# Patient Record
Sex: Female | Born: 1955 | Race: White | Hispanic: No | Smoking: Current some day smoker
Health system: Southern US, Community
[De-identification: ages and names within clinical notes are randomized; demographics above are authoritative.]

## PROBLEM LIST (undated history)

## (undated) DIAGNOSIS — I10 Essential (primary) hypertension: Secondary | ICD-10-CM

## (undated) DIAGNOSIS — I639 Cerebral infarction, unspecified: Secondary | ICD-10-CM

## (undated) DIAGNOSIS — S5290XA Unspecified fracture of unspecified forearm, initial encounter for closed fracture: Secondary | ICD-10-CM

## (undated) DIAGNOSIS — R51 Headache: Principal | ICD-10-CM

## (undated) DIAGNOSIS — I219 Acute myocardial infarction, unspecified: Secondary | ICD-10-CM

## (undated) DIAGNOSIS — I38 Endocarditis, valve unspecified: Secondary | ICD-10-CM

## (undated) DIAGNOSIS — E785 Hyperlipidemia, unspecified: Secondary | ICD-10-CM

## (undated) HISTORY — DX: Headache: R51

## (undated) HISTORY — PX: TUBAL LIGATION: SHX77

## (undated) HISTORY — PX: CAROTID ENDARTERECTOMY: SUR193

## (undated) HISTORY — DX: Hyperlipidemia, unspecified: E78.5

---

## 2007-02-06 ENCOUNTER — Emergency Department (HOSPITAL_COMMUNITY): Admission: EM | Admit: 2007-02-06 | Discharge: 2007-02-07 | Payer: Self-pay | Admitting: Emergency Medicine

## 2008-03-19 DIAGNOSIS — I219 Acute myocardial infarction, unspecified: Secondary | ICD-10-CM

## 2008-03-19 HISTORY — DX: Acute myocardial infarction, unspecified: I21.9

## 2008-03-19 HISTORY — PX: CORONARY ANGIOPLASTY WITH STENT PLACEMENT: SHX49

## 2009-10-28 ENCOUNTER — Emergency Department (HOSPITAL_COMMUNITY): Admission: EM | Admit: 2009-10-28 | Discharge: 2009-10-28 | Payer: Self-pay | Admitting: Emergency Medicine

## 2011-11-14 ENCOUNTER — Ambulatory Visit: Payer: Medicaid Other | Attending: Neurology | Admitting: Sleep Medicine

## 2011-11-14 DIAGNOSIS — G4700 Insomnia, unspecified: Secondary | ICD-10-CM

## 2011-11-14 DIAGNOSIS — G4730 Sleep apnea, unspecified: Secondary | ICD-10-CM | POA: Insufficient documentation

## 2011-11-14 DIAGNOSIS — G4710 Hypersomnia, unspecified: Secondary | ICD-10-CM | POA: Insufficient documentation

## 2011-11-16 ENCOUNTER — Emergency Department (HOSPITAL_COMMUNITY): Payer: Medicaid Other

## 2011-11-16 ENCOUNTER — Encounter (HOSPITAL_COMMUNITY): Payer: Self-pay | Admitting: *Deleted

## 2011-11-16 ENCOUNTER — Emergency Department (HOSPITAL_COMMUNITY)
Admission: EM | Admit: 2011-11-16 | Discharge: 2011-11-16 | Disposition: A | Discharge status: Home / Self Care | Payer: Medicaid Other | Attending: Emergency Medicine | Admitting: Emergency Medicine

## 2011-11-16 DIAGNOSIS — Z8673 Personal history of transient ischemic attack (TIA), and cerebral infarction without residual deficits: Secondary | ICD-10-CM | POA: Insufficient documentation

## 2011-11-16 DIAGNOSIS — G319 Degenerative disease of nervous system, unspecified: Secondary | ICD-10-CM | POA: Insufficient documentation

## 2011-11-16 DIAGNOSIS — M542 Cervicalgia: Secondary | ICD-10-CM | POA: Insufficient documentation

## 2011-11-16 DIAGNOSIS — M792 Neuralgia and neuritis, unspecified: Secondary | ICD-10-CM

## 2011-11-16 DIAGNOSIS — Z79899 Other long term (current) drug therapy: Secondary | ICD-10-CM | POA: Insufficient documentation

## 2011-11-16 DIAGNOSIS — F17200 Nicotine dependence, unspecified, uncomplicated: Secondary | ICD-10-CM | POA: Insufficient documentation

## 2011-11-16 DIAGNOSIS — IMO0002 Reserved for concepts with insufficient information to code with codable children: Secondary | ICD-10-CM | POA: Insufficient documentation

## 2011-11-16 DIAGNOSIS — I10 Essential (primary) hypertension: Secondary | ICD-10-CM | POA: Insufficient documentation

## 2011-11-16 DIAGNOSIS — I252 Old myocardial infarction: Secondary | ICD-10-CM | POA: Insufficient documentation

## 2011-11-16 DIAGNOSIS — H9209 Otalgia, unspecified ear: Secondary | ICD-10-CM | POA: Insufficient documentation

## 2011-11-16 HISTORY — DX: Cerebral infarction, unspecified: I63.9

## 2011-11-16 HISTORY — DX: Essential (primary) hypertension: I10

## 2011-11-16 HISTORY — DX: Acute myocardial infarction, unspecified: I21.9

## 2011-11-16 LAB — COMPREHENSIVE METABOLIC PANEL
ALT: 14 U/L (ref 0–35)
AST: 14 U/L (ref 0–37)
Albumin: 4 g/dL (ref 3.5–5.2)
Alkaline Phosphatase: 59 U/L (ref 39–117)
BUN: 22 mg/dL (ref 6–23)
CO2: 25 mEq/L (ref 19–32)
Calcium: 10 mg/dL (ref 8.4–10.5)
Chloride: 105 mEq/L (ref 96–112)
Creatinine, Ser: 0.81 mg/dL (ref 0.50–1.10)
GFR calc Af Amer: >90 mL/min (ref >90)
GFR calc non Af Amer: 79 mL/min — ABNORMAL LOW (ref >90)
Glucose, Bld: 99 mg/dL (ref 70–99)
Potassium: 3.7 mEq/L (ref 3.5–5.1)
Sodium: 138 mEq/L (ref 135–145)
Total Bilirubin: 0.2 mg/dL — ABNORMAL LOW (ref 0.3–1.2)
Total Protein: 6.9 g/dL (ref 6.0–8.3)

## 2011-11-16 LAB — CBC WITH DIFFERENTIAL/PLATELET
Basophils Absolute: 0 10*3/uL (ref 0.0–0.1)
Basophils Relative: 1 % (ref 0–1)
Eosinophils Absolute: 0.2 10*3/uL (ref 0.0–0.7)
Eosinophils Relative: 3 % (ref 0–5)
HCT: 38.3 % (ref 36.0–46.0)
Hemoglobin: 13.2 g/dL (ref 12.0–15.0)
Lymphocytes Relative: 40 % (ref 12–46)
Lymphs Abs: 2.4 10*3/uL (ref 0.7–4.0)
MCH: 32.1 pg (ref 26.0–34.0)
MCHC: 34.5 g/dL (ref 30.0–36.0)
MCV: 93.2 fL (ref 78.0–100.0)
Monocytes Absolute: 0.6 10*3/uL (ref 0.1–1.0)
Monocytes Relative: 11 % (ref 3–12)
Neutro Abs: 2.7 10*3/uL (ref 1.7–7.7)
Neutrophils Relative %: 46 % (ref 43–77)
Platelets: 159 10*3/uL (ref 150–400)
RBC: 4.11 MIL/uL (ref 3.87–5.11)
RDW: 13.1 % (ref 11.5–15.5)
WBC: 6 10*3/uL (ref 4.0–10.5)

## 2011-11-16 MED ORDER — OXYCODONE-ACETAMINOPHEN 5-325 MG PO TABS
1.0000 | ORAL_TABLET | Freq: Once | ORAL | Status: AC
  Administered 2011-11-16: 1 via ORAL
  Filled 2011-11-16: qty 1

## 2011-11-16 MED ORDER — CARBAMAZEPINE 200 MG PO TABS
200.0000 mg | ORAL_TABLET | Freq: Once | ORAL | Status: AC
  Administered 2011-11-16: 200 mg via ORAL
  Filled 2011-11-16: qty 1

## 2011-11-16 MED ORDER — CARBAMAZEPINE 200 MG PO TABS: 200.0000 mg | ORAL_TABLET | Freq: Two times a day (BID) | ORAL | Status: DC

## 2011-11-16 NOTE — Procedures (Signed)
NAME:  Emily Patterson, ARMES               ACCOUNT NO.:  [id number]  MEDICAL RECORD NO.:  [id number]          PATIENT TYPE:  OUT  LOCATION:  SLEEP LAB                     FACILITY:  [facility]  PHYSICIAN:  Jlon Betker A. Gerilyn Pilgrim, M.D. DATE OF BIRTH:  Nov 28, 1955  DATE OF STUDY:  11/14/2011                           NOCTURNAL POLYSOMNOGRAM  REFERRING PHYSICIAN:  [name] A. Gerilyn Pilgrim, M.D.  INDICATION:  A 56 year old who presents with hypersomnia, fatigue, and snoring.  The study is being done to evaluate for obstructive sleep apnea syndrome.  MEDICATIONS:  Neurontin, aspirin, Plavix, metoprolol, pravastatin, Triazolam, lisinopril, alprazolam, Percocet.  EPWORTH SLEEPINESS SCALE:  14.  BMI:  26.  ARCHITECTURAL SUMMARY:  The total recording time is 431 minutes.  Sleep efficiency 72%.  Sleep latency 15 minutes.  REM latency 141 minute. Stage N1 12%, N2 71%, N3 10%, and REM sleep 7%.  RESPIRATORY SUMMARY:  Baseline oxygen saturation is 96, lowest saturation 93 during non-REM sleep.  Diagnostic AHI 2.5 and RDI 5.  LIMB MOVEMENT SUMMARY:  PLM index 2.  ELECTROCARDIOGRAM SUMMARY:  Average heart rate is 59 with no significant dysrhythmias observed.  IMPRESSION:  Unremarkable nocturnal polysomnography.    Marios Gaiser A. Gerilyn Pilgrim, M.D.    KAD/MEDQ  D:  11/15/2011 09:34:40  T:  11/16/2011 03:10:31  Job:  [id number]
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
## 2011-11-16 NOTE — ED Notes (Signed)
Rambling speech, says she has trouble walking. Says her ears hurt.
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
## 2011-11-16 NOTE — ED Notes (Signed)
EDP in with pt 

## 2012-05-04 ENCOUNTER — Emergency Department (HOSPITAL_COMMUNITY): Payer: Medicaid Other

## 2012-05-04 ENCOUNTER — Encounter (HOSPITAL_COMMUNITY): Payer: Self-pay

## 2012-05-04 ENCOUNTER — Emergency Department (HOSPITAL_COMMUNITY)
Admission: EM | Admit: 2012-05-04 | Discharge: 2012-05-04 | Disposition: A | Discharge status: Home / Self Care | Payer: Medicaid Other | Attending: Emergency Medicine | Admitting: Emergency Medicine

## 2012-05-04 DIAGNOSIS — E876 Hypokalemia: Secondary | ICD-10-CM

## 2012-05-04 DIAGNOSIS — R197 Diarrhea, unspecified: Secondary | ICD-10-CM

## 2012-05-04 DIAGNOSIS — M545 Low back pain, unspecified: Secondary | ICD-10-CM | POA: Insufficient documentation

## 2012-05-04 DIAGNOSIS — G8929 Other chronic pain: Secondary | ICD-10-CM

## 2012-05-04 DIAGNOSIS — F17200 Nicotine dependence, unspecified, uncomplicated: Secondary | ICD-10-CM | POA: Insufficient documentation

## 2012-05-04 DIAGNOSIS — Z79899 Other long term (current) drug therapy: Secondary | ICD-10-CM | POA: Insufficient documentation

## 2012-05-04 DIAGNOSIS — I10 Essential (primary) hypertension: Secondary | ICD-10-CM | POA: Insufficient documentation

## 2012-05-04 DIAGNOSIS — R05 Cough: Secondary | ICD-10-CM | POA: Insufficient documentation

## 2012-05-04 DIAGNOSIS — H9209 Otalgia, unspecified ear: Secondary | ICD-10-CM | POA: Insufficient documentation

## 2012-05-04 DIAGNOSIS — R52 Pain, unspecified: Secondary | ICD-10-CM

## 2012-05-04 DIAGNOSIS — R059 Cough, unspecified: Secondary | ICD-10-CM | POA: Insufficient documentation

## 2012-05-04 DIAGNOSIS — IMO0002 Reserved for concepts with insufficient information to code with codable children: Secondary | ICD-10-CM | POA: Insufficient documentation

## 2012-05-04 DIAGNOSIS — I252 Old myocardial infarction: Secondary | ICD-10-CM | POA: Insufficient documentation

## 2012-05-04 DIAGNOSIS — Z8673 Personal history of transient ischemic attack (TIA), and cerebral infarction without residual deficits: Secondary | ICD-10-CM | POA: Insufficient documentation

## 2012-05-04 DIAGNOSIS — Z7902 Long term (current) use of antithrombotics/antiplatelets: Secondary | ICD-10-CM | POA: Insufficient documentation

## 2012-05-04 DIAGNOSIS — R1110 Vomiting, unspecified: Secondary | ICD-10-CM

## 2012-05-04 DIAGNOSIS — J189 Pneumonia, unspecified organism: Secondary | ICD-10-CM

## 2012-05-04 DIAGNOSIS — M25519 Pain in unspecified shoulder: Secondary | ICD-10-CM | POA: Insufficient documentation

## 2012-05-04 LAB — URINALYSIS, ROUTINE W REFLEX MICROSCOPIC
Glucose, UA: NEGATIVE mg/dL
Ketones, ur: 15 mg/dL — AB
Leukocytes, UA: NEGATIVE
Nitrite: NEGATIVE
Protein, ur: NEGATIVE mg/dL
Specific Gravity, Urine: 1.02 (ref 1.005–1.030)
Urobilinogen, UA: 0.2 mg/dL (ref 0.0–1.0)
pH: 6 (ref 5.0–8.0)

## 2012-05-04 LAB — CBC WITH DIFFERENTIAL/PLATELET
Basophils Absolute: 0 10*3/uL (ref 0.0–0.1)
Basophils Relative: 0 % (ref 0–1)
Eosinophils Absolute: 0 10*3/uL (ref 0.0–0.7)
Eosinophils Relative: 0 % (ref 0–5)
HCT: 39.7 % (ref 36.0–46.0)
Hemoglobin: 14 g/dL (ref 12.0–15.0)
Lymphocytes Relative: 20 % (ref 12–46)
Lymphs Abs: 0.6 10*3/uL — ABNORMAL LOW (ref 0.7–4.0)
MCH: 32.5 pg (ref 26.0–34.0)
MCHC: 35.3 g/dL (ref 30.0–36.0)
MCV: 92.1 fL (ref 78.0–100.0)
Monocytes Absolute: 0.1 10*3/uL (ref 0.1–1.0)
Monocytes Relative: 4 % (ref 3–12)
Neutro Abs: 2.1 10*3/uL (ref 1.7–7.7)
Neutrophils Relative %: 76 % (ref 43–77)
Platelets: 102 10*3/uL — ABNORMAL LOW (ref 150–400)
RBC: 4.31 MIL/uL (ref 3.87–5.11)
RDW: 12.9 % (ref 11.5–15.5)
WBC: 2.8 10*3/uL — ABNORMAL LOW (ref 4.0–10.5)

## 2012-05-04 LAB — COMPREHENSIVE METABOLIC PANEL
ALT: 28 U/L (ref 0–35)
AST: 34 U/L (ref 0–37)
Albumin: 3.9 g/dL (ref 3.5–5.2)
Alkaline Phosphatase: 124 U/L — ABNORMAL HIGH (ref 39–117)
BUN: 12 mg/dL (ref 6–23)
CO2: 23 mEq/L (ref 19–32)
Calcium: 9.3 mg/dL (ref 8.4–10.5)
Chloride: 95 mEq/L — ABNORMAL LOW (ref 96–112)
Creatinine, Ser: 0.77 mg/dL (ref 0.50–1.10)
GFR calc Af Amer: >90 mL/min (ref >90)
GFR calc non Af Amer: >90 mL/min (ref >90)
Glucose, Bld: 110 mg/dL — ABNORMAL HIGH (ref 70–99)
Potassium: 3.2 mEq/L — ABNORMAL LOW (ref 3.5–5.1)
Sodium: 132 mEq/L — ABNORMAL LOW (ref 135–145)
Total Bilirubin: 0.3 mg/dL (ref 0.3–1.2)
Total Protein: 7.8 g/dL (ref 6.0–8.3)

## 2012-05-04 LAB — URINE MICROSCOPIC-ADD ON

## 2012-05-04 LAB — TROPONIN I: Troponin I: <0.3 ng/mL (ref <0.30)

## 2012-05-04 MED ORDER — MUCINEX DM 30-600 MG PO TB12: ORAL_TABLET | ORAL | Status: DC

## 2012-05-04 MED ORDER — LORAZEPAM 2 MG/ML IJ SOLN
1.0000 mg | Freq: Once | INTRAMUSCULAR | Status: AC
  Administered 2012-05-04: 1 mg via INTRAVENOUS
  Filled 2012-05-04: qty 1

## 2012-05-04 MED ORDER — LEVOFLOXACIN 750 MG PO TABS: 750.0000 mg | ORAL_TABLET | Freq: Every day | ORAL | Status: DC

## 2012-05-04 MED ORDER — CYCLOBENZAPRINE HCL 10 MG PO TABS
10.0000 mg | ORAL_TABLET | Freq: Once | ORAL | Status: AC
  Administered 2012-05-04: 10 mg via ORAL
  Filled 2012-05-04: qty 1

## 2012-05-04 MED ORDER — METOCLOPRAMIDE HCL 5 MG/ML IJ SOLN
10.0000 mg | Freq: Once | INTRAMUSCULAR | Status: AC
  Administered 2012-05-04: 10 mg via INTRAVENOUS
  Filled 2012-05-04: qty 2

## 2012-05-04 MED ORDER — METHOCARBAMOL 500 MG PO TABS: 1000.0000 mg | ORAL_TABLET | Freq: Four times a day (QID) | ORAL | Status: DC

## 2012-05-04 MED ORDER — KETOROLAC TROMETHAMINE 30 MG/ML IJ SOLN
30.0000 mg | Freq: Once | INTRAMUSCULAR | Status: AC
  Administered 2012-05-04: 30 mg via INTRAVENOUS
  Filled 2012-05-04: qty 1

## 2012-05-04 MED ORDER — ONDANSETRON 8 MG PO TBDP: 8.0000 mg | ORAL_TABLET | Freq: Three times a day (TID) | ORAL | Status: DC | PRN

## 2012-05-04 MED ORDER — DIPHENHYDRAMINE HCL 50 MG/ML IJ SOLN
25.0000 mg | Freq: Once | INTRAMUSCULAR | Status: AC
  Administered 2012-05-04: 25 mg via INTRAVENOUS
  Filled 2012-05-04: qty 1

## 2012-05-04 MED ORDER — POTASSIUM CHLORIDE CRYS ER 20 MEQ PO TBCR
40.0000 meq | EXTENDED_RELEASE_TABLET | Freq: Once | ORAL | Status: AC
  Administered 2012-05-04: 40 meq via ORAL
  Filled 2012-05-04: qty 2

## 2012-05-04 MED ORDER — ONDANSETRON HCL 4 MG/2ML IJ SOLN
4.0000 mg | Freq: Once | INTRAMUSCULAR | Status: AC
  Administered 2012-05-04: 4 mg via INTRAVENOUS
  Filled 2012-05-04: qty 2

## 2012-05-04 MED ORDER — LEVOFLOXACIN IN D5W 750 MG/150ML IV SOLN
750.0000 mg | Freq: Once | INTRAVENOUS | Status: AC
  Administered 2012-05-04: 750 mg via INTRAVENOUS
  Filled 2012-05-04: qty 150

## 2012-05-04 MED ORDER — SODIUM CHLORIDE 0.9 % IV SOLN
1000.0000 mL | Freq: Once | INTRAVENOUS | Status: AC
  Administered 2012-05-04: 1000 mL via INTRAVENOUS

## 2012-05-04 MED ORDER — POTASSIUM CHLORIDE CRYS ER 20 MEQ PO TBCR: 20.0000 meq | EXTENDED_RELEASE_TABLET | Freq: Two times a day (BID) | ORAL | Status: DC

## 2012-05-04 MED ORDER — TRAMADOL-ACETAMINOPHEN 37.5-325 MG PO TABS: ORAL_TABLET | ORAL | Status: DC

## 2012-05-04 MED ORDER — FENTANYL CITRATE 0.05 MG/ML IJ SOLN
50.0000 ug | INTRAMUSCULAR | Status: DC | PRN
  Administered 2012-05-04: 50 ug via INTRAVENOUS
  Filled 2012-05-04: qty 2

## 2012-05-04 MED ORDER — SODIUM CHLORIDE 0.9 % IV SOLN
1000.0000 mL | INTRAVENOUS | Status: DC
  Administered 2012-05-04: 1000 mL via INTRAVENOUS

## 2012-05-04 NOTE — ED Provider Notes (Signed)
History    This chart was scribed for Ward Givens, MD by Melba Coon, ED Scribe. The patient was seen in room APA10/APA10 and the patient's care was started at 11:16AM.    CSN: [id number]  Arrival date & time 05/04/12  1014   First MD Initiated Contact with Patient 05/04/12 1030      Chief Complaint  Patient presents with  . Emesis    (Consider location/radiation/quality/duration/timing/severity/associated sxs/prior treatment) The history is provided by the patient. No language interpreter was used.   RUDEAN ICENHOUR is a 57 y.o. female who presents to the Emergency Department complaining of constant, sharp, moderate to severe lower back pain that radiates down to her upper legs with an onset 4 days ago. She reports that her back pain along with a persistent cough and bilateral shoulder pain started in he morning 4 days ago. She reports she saw her cardiologist who is also her PCP the same day and he did not think it was heart-related. She was Rx flexeril which did not alleviate the pain. Later that day of onset, she began to have emesis about 4-5x a day along with some chills. She reports decreased sleep compared to baseline due to the severity of her back pain. She also reports diarrhea x3 that started today. Denies HA, fever, neck pain, sore throat, rash, CP, SOB, abdominal pain, dysuria, or extremity edema, weakness, numbness, or tingling.   She reports a chronic history of right otalgia for the past 28 years; she reports Dr. Gerilyn Pilgrim injected her in the scalp in his office office alleviated her pain 3 months ago and she was pain-free however the pain is starting to come back. She's encouraged to followup with Dr. Gerilyn Pilgrim to get the injection again.. No other pertinent medical symptoms. She reports she is an everyday smoker but has not smoked in the last 3 days.  PCP/Cardiologist: Dr Earna Coder in [facility]  Past Medical History  Diagnosis Date  . Hypertension   . Myocardial  infarction   . Stroke     Past Surgical History  Procedure Laterality Date  . Coronary angioplasty with stent placement    . Tubal ligation      No family history on file.  History  Substance Use Topics  . Smoking status: Current Every Day Smoker  . Smokeless tobacco: Not on file  . Alcohol Use: No   patient states she's on disability for her heart Lives at home Lives with spouse  OB History   Grav Para Term Preterm Abortions TAB SAB Ect Mult Living                  Review of Systems  Gastrointestinal: Positive for vomiting.   10 Systems reviewed and all are negative for acute change except as noted in the HPI.   Allergies  Other and Latex  Home Medications   Current Outpatient Rx  Name  Route  Sig  Dispense  Refill  . albuterol (PROAIR HFA) 108 (90 BASE) MCG/ACT inhaler   Inhalation   Inhale 2 puffs into the lungs every 6 (six) hours as needed for wheezing or shortness of breath.          . ALPRAZolam (XANAX) 1 MG tablet   Oral   Take 1 mg by mouth 3 (three) times daily. For anxiety         . aspirin EC 81 MG tablet   Oral   Take 81 mg by mouth daily.         Marland Kitchen  clopidogrel (PLAVIX) 75 MG tablet   Oral   Take 75 mg by mouth daily.         . metoprolol tartrate (LOPRESSOR) 25 MG tablet   Oral   Take 25 mg by mouth 2 (two) times daily.         . mometasone (NASONEX) 50 MCG/ACT nasal spray   Nasal   Place 2 sprays into the nose daily.         . pravastatin (PRAVACHOL) 80 MG tablet   Oral   Take 80 mg by mouth every evening.           BP 137/85  Pulse 95  Temp(Src) 99.2 F (37.3 C) (Oral)  Resp 20  Ht 5\' 2"  (1.575 m)  Wt 144 lb (65.318 kg)  BMI 26.33 kg/m2  SpO2 93%  Vital signs normal    Physical Exam  Nursing note and vitals reviewed. Constitutional: She is oriented to person, place, and time. She appears well-developed and well-nourished.  Non-toxic appearance. She does not appear ill. No distress.  HENT:  Head:  Normocephalic and atraumatic.  Right Ear: External ear normal.  Left Ear: External ear normal.  Nose: Nose normal. No mucosal edema or rhinorrhea.  Mouth/Throat: Oropharynx is clear and moist and mucous membranes are normal. No dental abscesses or edematous.  Eyes: Conjunctivae and EOM are normal. Pupils are equal, round, and reactive to light.  Neck: Normal range of motion and full passive range of motion without pain. Neck supple.  Cardiovascular: Normal rate, regular rhythm and normal heart sounds.  Exam reveals no gallop and no friction rub.   No murmur heard. Pulmonary/Chest: Effort normal and breath sounds normal. No respiratory distress. She has no wheezes. She has no rhonchi. She has no rales. She exhibits no tenderness and no crepitus.  Abdominal: Soft. Normal appearance and bowel sounds are normal. She exhibits no distension. There is tenderness. There is no rebound and no guarding.  Mild lower abdominal tenderness.  Musculoskeletal: Normal range of motion. She exhibits tenderness. She exhibits no edema.       Lumbar back: She exhibits tenderness, bony tenderness, pain and spasm. She exhibits no swelling, no edema and no deformity.  Back is non-tender in the thoracic and lumbar areas but lower back is tender in the sacral area. Moves all extremities well.   Neurological: She is alert and oriented to person, place, and time. She has normal strength. No cranial nerve deficit.  Skin: Skin is warm, dry and intact. No rash noted. She is not diaphoretic. No erythema. No pallor.  Psychiatric: She has a normal mood and affect. Her speech is normal and behavior is normal. Her mood appears not anxious.    ED Course  Procedures (including critical care time)  Medications  0.9 %  sodium chloride infusion (0 mLs Intravenous Stopped 05/04/12 1230)    Followed by  0.9 %  sodium chloride infusion (1,000 mLs Intravenous New Bag/Given 05/04/12 1502)  ondansetron (ZOFRAN) injection 4 mg (4 mg  Intravenous Given 05/04/12 1145)  LORazepam (ATIVAN) injection 1 mg (1 mg Intravenous Given 05/04/12 1148)  ketorolac (TORADOL) 30 MG/ML injection 30 mg (30 mg Intravenous Given 05/04/12 1143)  levofloxacin (LEVAQUIN) IVPB 750 mg (0 mg Intravenous Stopped 05/04/12 1350)  potassium chloride SA (K-DUR,KLOR-CON) CR tablet 40 mEq (40 mEq Oral Given 05/04/12 1253)  cyclobenzaprine (FLEXERIL) tablet 10 mg (10 mg Oral Given 05/04/12 1359)  metoCLOPramide (REGLAN) injection 10 mg (10 mg Intravenous Given 05/04/12 1459)  diphenhydrAMINE (  BENADRYL) injection 25 mg (25 mg Intravenous Given 05/04/12 1502)     DIAGNOSTIC STUDIES: Oxygen Saturation is 93% on room air, low by my interpretation.    COORDINATION OF CARE:  Although patient states the Flexeril did not help her back pain she currently now does not have pain in her upper back in the thoracic or lumbar area which is where she stated her pain had been when it started 4 days ago. She now just has pain in her lower sacral region.  11:21AM - Toradol, ativan, Zofran, and IV fluids will be ordered for Emiya D Christopoulos. She is advised to follow up with her PCP for her chronic otalgia.  12:35PM- lab results reviewed  12:38PM - recheck; imaging reviewed and has a right-sided pneumonia. KCl tablet and Levaquin will be ordered for Mrs Kohen.  15:45 Patient ambulated with no assistance. O2 sat 97-100% while ambulating.   Results for orders placed during the hospital encounter of 05/04/12  URINALYSIS, ROUTINE W REFLEX MICROSCOPIC      Result Value Range   Color, Urine YELLOW  YELLOW   APPearance CLEAR  CLEAR   Specific Gravity, Urine 1.020  1.005 - 1.030   pH 6.0  5.0 - 8.0   Glucose, UA NEGATIVE  NEGATIVE mg/dL   Hgb urine dipstick SMALL (*) NEGATIVE   Bilirubin Urine SMALL (*) NEGATIVE   Ketones, ur 15 (*) NEGATIVE mg/dL   Protein, ur NEGATIVE  NEGATIVE mg/dL   Urobilinogen, UA 0.2  0.0 - 1.0 mg/dL   Nitrite NEGATIVE  NEGATIVE   Leukocytes, UA  NEGATIVE  NEGATIVE  CBC WITH DIFFERENTIAL      Result Value Range   WBC 2.8 (*) 4.0 - 10.5 K/uL   RBC 4.31  3.87 - 5.11 MIL/uL   Hemoglobin 14.0  12.0 - 15.0 g/dL   HCT 95.6  21.3 - 08.6 %   MCV 92.1  78.0 - 100.0 fL   MCH 32.5  26.0 - 34.0 pg   MCHC 35.3  30.0 - 36.0 g/dL   RDW 57.8  46.9 - 62.9 %   Platelets 102 (*) 150 - 400 K/uL   Neutrophils Relative 76  43 - 77 %   Neutro Abs 2.1  1.7 - 7.7 K/uL   Lymphocytes Relative 20  12 - 46 %   Lymphs Abs 0.6 (*) 0.7 - 4.0 K/uL   Monocytes Relative 4  3 - 12 %   Monocytes Absolute 0.1  0.1 - 1.0 K/uL   Eosinophils Relative 0  0 - 5 %   Eosinophils Absolute 0.0  0.0 - 0.7 K/uL   Basophils Relative 0  0 - 1 %   Basophils Absolute 0.0  0.0 - 0.1 K/uL  COMPREHENSIVE METABOLIC PANEL      Result Value Range   Sodium 132 (*) 135 - 145 mEq/L   Potassium 3.2 (*) 3.5 - 5.1 mEq/L   Chloride 95 (*) 96 - 112 mEq/L   CO2 23  19 - 32 mEq/L   Glucose, Bld 110 (*) 70 - 99 mg/dL   BUN 12  6 - 23 mg/dL   Creatinine, Ser 5.28  0.50 - 1.10 mg/dL   Calcium 9.3  8.4 - 41.3 mg/dL   Total Protein 7.8  6.0 - 8.3 g/dL   Albumin 3.9  3.5 - 5.2 g/dL   AST 34  0 - 37 U/L   ALT 28  0 - 35 U/L   Alkaline Phosphatase 124 (*) 39 - 117 U/L  Total Bilirubin 0.3  0.3 - 1.2 mg/dL   GFR calc non Af Amer >90  >90 mL/min   GFR calc Af Amer >90  >90 mL/min  TROPONIN I      Result Value Range   Troponin I <0.30  <0.30 ng/mL  URINE MICROSCOPIC-ADD ON      Result Value Range   Squamous Epithelial / LPF FEW (*) RARE   WBC, UA 3-6  <3 WBC/hpf   RBC / HPF 3-6  <3 RBC/hpf    Laboratory interpretation all normal except hypokalemia, mild hyponatremia, low total white cell count without neutropenia consistent with viral illness   Dg Chest 2 View  05/04/2012  *RADIOLOGY REPORT*  Clinical Data: Cough, chills.  CHEST - 2 VIEW  Comparison: None  Findings: Area consolidation in the right lung base laterally compatible with pneumonia.  This appears to be located anteriorly  on the lateral view, likely within the lateral segment of the right middle lobe.  Left lung is clear.  Heart is normal size.  No effusions or acute bony abnormality.  IMPRESSION: Right basilar pneumonia, likely right middle lobe.   Original Report Authenticated By: Charlett Nose, M.D.     Date: 05/04/2012  Rate: 90  Rhythm: normal sinus rhythm  QRS Axis: normal  Intervals: normal  ST/T Wave abnormalities: nonspecific ST/T changes  Conduction Disutrbances:none  Narrative Interpretation:   Old EKG Reviewed: none available     1. Community acquired pneumonia   2. Vomiting and diarrhea   3. Hypokalemia   4. Body aches   5. Chronic ear pain, right     New Prescriptions   DEXTROMETHORPHAN-GUAIFENESIN (MUCINEX DM) 30-600 MG TB12    Take 1 po BID prn cough   LEVOFLOXACIN (LEVAQUIN) 750 MG TABLET    Take 1 tablet (750 mg total) by mouth daily.   METHOCARBAMOL (ROBAXIN) 500 MG TABLET    Take 2 tablets (1,000 mg total) by mouth 4 (four) times daily.   ONDANSETRON (ZOFRAN ODT) 8 MG DISINTEGRATING TABLET    Take 1 tablet (8 mg total) by mouth every 8 (eight) hours as needed for nausea.   POTASSIUM CHLORIDE SA (K-DUR,KLOR-CON) 20 MEQ TABLET    Take 1 tablet (20 mEq total) by mouth 2 (two) times daily.   TRAMADOL-ACETAMINOPHEN (ULTRACET) 37.5-325 MG PER TABLET    2 tabs po QID prn pain    Plan discharge  Devoria Albe, MD, FACEP   MDM   I personally performed the services described in this documentation, which was scribed in my presence. The recorded information has been reviewed and considered.  Devoria Albe, MD, Armando Gang        Ward Givens, MD 05/04/12 [phone]

## 2012-05-04 NOTE — ED Notes (Signed)
Patient ambulated with no assistance.  o2 sat 97-100% while ambulating.

## 2012-05-04 NOTE — ED Notes (Signed)
Complain of back pain, ear pain, headache and vomiting

## 2012-07-27 ENCOUNTER — Emergency Department (HOSPITAL_COMMUNITY)
Admission: EM | Admit: 2012-07-27 | Discharge: 2012-07-28 | Discharge status: Against Medical Advice | Payer: Medicaid Other | Attending: Emergency Medicine | Admitting: Emergency Medicine

## 2012-07-27 ENCOUNTER — Encounter (HOSPITAL_COMMUNITY): Payer: Self-pay | Admitting: *Deleted

## 2012-07-27 DIAGNOSIS — X58XXXA Exposure to other specified factors, initial encounter: Secondary | ICD-10-CM | POA: Insufficient documentation

## 2012-07-27 DIAGNOSIS — Y929 Unspecified place or not applicable: Secondary | ICD-10-CM | POA: Insufficient documentation

## 2012-07-27 DIAGNOSIS — Y939 Activity, unspecified: Secondary | ICD-10-CM | POA: Insufficient documentation

## 2012-07-27 DIAGNOSIS — I10 Essential (primary) hypertension: Secondary | ICD-10-CM | POA: Insufficient documentation

## 2012-07-27 DIAGNOSIS — F17200 Nicotine dependence, unspecified, uncomplicated: Secondary | ICD-10-CM | POA: Insufficient documentation

## 2012-07-27 DIAGNOSIS — S4980XA Other specified injuries of shoulder and upper arm, unspecified arm, initial encounter: Secondary | ICD-10-CM | POA: Insufficient documentation

## 2012-07-27 DIAGNOSIS — S46909A Unspecified injury of unspecified muscle, fascia and tendon at shoulder and upper arm level, unspecified arm, initial encounter: Secondary | ICD-10-CM | POA: Insufficient documentation

## 2012-07-27 NOTE — ED Notes (Signed)
Pt continues to demand pain medications.  Reinforced with pt that until the she was assessed by physician we were unable to administer medications.  Pt signed forms for release of records from [place] and [place].

## 2012-07-27 NOTE — ED Notes (Signed)
Pt observed resting with eyes closed.  Respirations regular, even and labored.  Pt responds to verbal stimuli, but speech remains slurred and pt needing to have questions repeated to obtain appropriate answers.

## 2012-07-27 NOTE — ED Notes (Signed)
Spoke with house supervisor at [facility] to clarify if pt had or hadn't been seen.  Per supervisor, they have no record of pt being at facility in previous 24 hours.

## 2012-07-27 NOTE — ED Notes (Addendum)
Pt reports both arms are broken.  States that she was at [facility] about 3 hours ago and was not given any mediation for pain.  Pt also states that she went to [facility].  Reoriented pt and informed her that she was presently at [facility].  Pt reports that she was also seen at [facility], but then denies being seen there. Pt tearful and demanding pain medication.  Reinforced with pt that we are unable to give medication till ordered by physician.  Pt speech is somewhat slurred and gait slightly unsteady.

## 2012-07-27 NOTE — ED Notes (Signed)
Pt yelling and very angry that she hasn't been seen yet to have pain medications ordered.  Explained to pt that she will be seen as soon as possible, but that I would be unable to provide exact time.  Pt yelling and cursing. States "I'll just go to [employer] with pt that she will be seen, and encouraged patience.

## 2012-07-28 NOTE — ED Notes (Signed)
Pt visualized by security leaving property as the passenger in a private vehicle.

## 2012-07-28 NOTE — ED Notes (Addendum)
Pt demanding to leave stating that she was in pain and wasn't waiting any longer.  Reinforced with pt that provider is aware of her needs, and will be seeing her as soon as able.  Also informed pt that records from treatment received from [facility] have been requested so that provider has a the most accurate picture of condition and best determination of treatment.  Pt demanding to leave stating "I'll just go to damn [place]!"  Requested pt sign AMA form prior to discharge.  Pt stated "No, I"m not signing shit!"  Pt left department.  Gait slightly unsteady and pt unable to walk straight line down hall.

## 2012-08-13 ENCOUNTER — Other Ambulatory Visit (HOSPITAL_COMMUNITY): Payer: Self-pay | Admitting: Orthopaedic Surgery

## 2012-08-14 ENCOUNTER — Other Ambulatory Visit (HOSPITAL_COMMUNITY): Payer: Self-pay | Admitting: *Deleted

## 2012-08-14 ENCOUNTER — Encounter (HOSPITAL_COMMUNITY): Payer: Self-pay | Admitting: Pharmacy Technician

## 2012-08-14 ENCOUNTER — Encounter (HOSPITAL_COMMUNITY): Payer: Self-pay | Admitting: *Deleted

## 2012-08-15 ENCOUNTER — Ambulatory Visit (HOSPITAL_COMMUNITY): Payer: Medicaid Other

## 2012-08-15 ENCOUNTER — Ambulatory Visit (HOSPITAL_COMMUNITY): Payer: Medicaid Other | Admitting: Anesthesiology

## 2012-08-15 ENCOUNTER — Ambulatory Visit (HOSPITAL_COMMUNITY)
Admission: RE | Admit: 2012-08-15 | Discharge: 2012-08-17 | Disposition: A | Discharge status: Home / Self Care | Payer: Medicaid Other | Source: Ambulatory Visit | Attending: Orthopaedic Surgery | Admitting: Orthopaedic Surgery

## 2012-08-15 ENCOUNTER — Encounter (HOSPITAL_COMMUNITY): Payer: Self-pay | Admitting: *Deleted

## 2012-08-15 ENCOUNTER — Encounter (HOSPITAL_COMMUNITY): Payer: Self-pay | Admitting: Anesthesiology

## 2012-08-15 ENCOUNTER — Encounter (HOSPITAL_COMMUNITY)
Admission: RE | Disposition: A | Discharge status: Home / Self Care | Payer: Self-pay | Source: Ambulatory Visit | Attending: Orthopaedic Surgery

## 2012-08-15 DIAGNOSIS — I252 Old myocardial infarction: Secondary | ICD-10-CM | POA: Insufficient documentation

## 2012-08-15 DIAGNOSIS — S52501A Unspecified fracture of the lower end of right radius, initial encounter for closed fracture: Secondary | ICD-10-CM

## 2012-08-15 DIAGNOSIS — R209 Unspecified disturbances of skin sensation: Secondary | ICD-10-CM | POA: Insufficient documentation

## 2012-08-15 DIAGNOSIS — F17200 Nicotine dependence, unspecified, uncomplicated: Secondary | ICD-10-CM | POA: Insufficient documentation

## 2012-08-15 DIAGNOSIS — W108XXA Fall (on) (from) other stairs and steps, initial encounter: Secondary | ICD-10-CM | POA: Insufficient documentation

## 2012-08-15 DIAGNOSIS — Y92009 Unspecified place in unspecified non-institutional (private) residence as the place of occurrence of the external cause: Secondary | ICD-10-CM | POA: Insufficient documentation

## 2012-08-15 DIAGNOSIS — S52599A Other fractures of lower end of unspecified radius, initial encounter for closed fracture: Secondary | ICD-10-CM | POA: Insufficient documentation

## 2012-08-15 DIAGNOSIS — I10 Essential (primary) hypertension: Secondary | ICD-10-CM | POA: Insufficient documentation

## 2012-08-15 DIAGNOSIS — Z79899 Other long term (current) drug therapy: Secondary | ICD-10-CM | POA: Insufficient documentation

## 2012-08-15 HISTORY — PX: OPEN REDUCTION INTERNAL FIXATION (ORIF) DISTAL RADIAL FRACTURE: SHX5989

## 2012-08-15 HISTORY — DX: Endocarditis, valve unspecified: I38

## 2012-08-15 HISTORY — DX: Unspecified fracture of unspecified forearm, initial encounter for closed fracture: S52.90XA

## 2012-08-15 LAB — CBC
HCT: 31.7 % — ABNORMAL LOW (ref 36.0–46.0)
Hemoglobin: 10.7 g/dL — ABNORMAL LOW (ref 12.0–15.0)
MCH: 31.6 pg (ref 26.0–34.0)
MCHC: 33.8 g/dL (ref 30.0–36.0)
MCV: 93.5 fL (ref 78.0–100.0)
Platelets: 169 10*3/uL (ref 150–400)
RBC: 3.39 MIL/uL — ABNORMAL LOW (ref 3.87–5.11)
RDW: 13.9 % (ref 11.5–15.5)
WBC: 4.7 10*3/uL (ref 4.0–10.5)

## 2012-08-15 LAB — SURGICAL PCR SCREEN
MRSA, PCR: NEGATIVE
Staphylococcus aureus: NEGATIVE

## 2012-08-15 LAB — BASIC METABOLIC PANEL
BUN: 10 mg/dL (ref 6–23)
CO2: 30 mEq/L (ref 19–32)
Calcium: 9.5 mg/dL (ref 8.4–10.5)
Chloride: 103 mEq/L (ref 96–112)
Creatinine, Ser: 0.84 mg/dL (ref 0.50–1.10)
GFR calc Af Amer: 87 mL/min — ABNORMAL LOW (ref >90)
GFR calc non Af Amer: 75 mL/min — ABNORMAL LOW (ref >90)
Glucose, Bld: 92 mg/dL (ref 70–99)
Potassium: 4.1 mEq/L (ref 3.5–5.1)
Sodium: 139 mEq/L (ref 135–145)

## 2012-08-15 SURGERY — OPEN REDUCTION INTERNAL FIXATION (ORIF) DISTAL RADIUS FRACTURE
Anesthesia: General | Site: Wrist | Laterality: Right | Wound class: Clean

## 2012-08-15 MED ORDER — CEFAZOLIN SODIUM-DEXTROSE 2-3 GM-% IV SOLR
INTRAVENOUS | Status: AC
  Filled 2012-08-15: qty 50

## 2012-08-15 MED ORDER — METHOCARBAMOL 500 MG PO TABS
500.0000 mg | ORAL_TABLET | Freq: Four times a day (QID) | ORAL | Status: DC | PRN
  Administered 2012-08-15 – 2012-08-16 (×3): 500 mg via ORAL
  Filled 2012-08-15 (×4): qty 1

## 2012-08-15 MED ORDER — BUPIVACAINE HCL (PF) 0.25 % IJ SOLN
INTRAMUSCULAR | Status: AC
  Filled 2012-08-15: qty 30

## 2012-08-15 MED ORDER — METHOCARBAMOL 500 MG PO TABS
ORAL_TABLET | ORAL | Status: AC
  Filled 2012-08-15: qty 1

## 2012-08-15 MED ORDER — CEFAZOLIN SODIUM-DEXTROSE 2-3 GM-% IV SOLR
2.0000 g | INTRAVENOUS | Status: AC
  Administered 2012-08-15: 2 g via INTRAVENOUS

## 2012-08-15 MED ORDER — MIDAZOLAM HCL 5 MG/5ML IJ SOLN
INTRAMUSCULAR | Status: DC | PRN
  Administered 2012-08-15 (×2): 1 mg via INTRAVENOUS

## 2012-08-15 MED ORDER — METHOCARBAMOL 100 MG/ML IJ SOLN
500.0000 mg | Freq: Four times a day (QID) | INTRAVENOUS | Status: DC | PRN
  Filled 2012-08-15: qty 5

## 2012-08-15 MED ORDER — LACTATED RINGERS IV SOLN
INTRAVENOUS | Status: DC
  Administered 2012-08-15: 23:00:00 via INTRAVENOUS

## 2012-08-15 MED ORDER — ONDANSETRON HCL 4 MG/2ML IJ SOLN
INTRAMUSCULAR | Status: DC | PRN
  Administered 2012-08-15: 4 mg via INTRAVENOUS

## 2012-08-15 MED ORDER — HYDROMORPHONE HCL PF 1 MG/ML IJ SOLN
INTRAMUSCULAR | Status: AC
  Filled 2012-08-15: qty 1

## 2012-08-15 MED ORDER — PROPOFOL 10 MG/ML IV BOLUS
INTRAVENOUS | Status: DC | PRN
  Administered 2012-08-15: 150 mg via INTRAVENOUS

## 2012-08-15 MED ORDER — FENTANYL CITRATE 0.05 MG/ML IJ SOLN
INTRAMUSCULAR | Status: DC | PRN
  Administered 2012-08-15 (×2): 25 ug via INTRAVENOUS
  Administered 2012-08-15 (×4): 50 ug via INTRAVENOUS

## 2012-08-15 MED ORDER — BUPIVACAINE HCL 0.25 % IJ SOLN
INTRAMUSCULAR | Status: DC | PRN
  Administered 2012-08-15: 4 mL

## 2012-08-15 MED ORDER — PROMETHAZINE HCL 25 MG/ML IJ SOLN: 6.2500 mg | INTRAMUSCULAR | Status: DC | PRN

## 2012-08-15 MED ORDER — MUPIROCIN 2 % EX OINT
TOPICAL_OINTMENT | Freq: Two times a day (BID) | CUTANEOUS | Status: DC
  Administered 2012-08-15: 1 via NASAL

## 2012-08-15 MED ORDER — OXYCODONE-ACETAMINOPHEN 5-325 MG PO TABS
1.0000 | ORAL_TABLET | ORAL | Status: DC | PRN
Start: 2012-08-15 — End: 2013-06-28

## 2012-08-15 MED ORDER — MEPERIDINE HCL 50 MG/ML IJ SOLN: 6.2500 mg | INTRAMUSCULAR | Status: DC | PRN

## 2012-08-15 MED ORDER — LACTATED RINGERS IV SOLN
INTRAVENOUS | Status: DC | PRN
  Administered 2012-08-15: 20:00:00 via INTRAVENOUS

## 2012-08-15 MED ORDER — MUPIROCIN 2 % EX OINT
TOPICAL_OINTMENT | CUTANEOUS | Status: AC
  Administered 2012-08-15: 1 via NASAL
  Filled 2012-08-15: qty 22

## 2012-08-15 MED ORDER — METHOCARBAMOL 500 MG PO TABS: 500.0000 mg | ORAL_TABLET | Freq: Four times a day (QID) | ORAL | Status: DC | PRN

## 2012-08-15 MED ORDER — HYDROMORPHONE HCL PF 1 MG/ML IJ SOLN
0.2500 mg | INTRAMUSCULAR | Status: DC | PRN
  Administered 2012-08-15 (×2): 0.5 mg via INTRAVENOUS

## 2012-08-15 SURGICAL SUPPLY — 53 items
BAG ZIPLOCK 12X15 (MISCELLANEOUS) ×2 IMPLANT
BANDAGE ELASTIC 3 VELCRO ST LF (GAUZE/BANDAGES/DRESSINGS) ×2 IMPLANT
BIT DRILL 2.0 LNG QUCK RELEASE (BIT) ×1 IMPLANT
BIT DRILL 2.8X5 QR DISP (BIT) ×2 IMPLANT
CLOTH BEACON ORANGE TIMEOUT ST (SAFETY) ×2 IMPLANT
CUFF TOURN SGL QUICK 18 (TOURNIQUET CUFF) ×2 IMPLANT
DRAPE C-ARM 42X72 X-RAY (DRAPES) ×2 IMPLANT
DRAPE U-SHAPE 47X51 STRL (DRAPES) ×2 IMPLANT
DRILL 2.0 LNG QUICK RELEASE (BIT) ×2
DRSG ADAPTIC 3X8 NADH LF (GAUZE/BANDAGES/DRESSINGS) ×2 IMPLANT
DRSG PAD ABDOMINAL 8X10 ST (GAUZE/BANDAGES/DRESSINGS) ×2 IMPLANT
DURAPREP 26ML APPLICATOR (WOUND CARE) ×2 IMPLANT
ELECT REM PT RETURN 9FT ADLT (ELECTROSURGICAL) ×2
ELECTRODE REM PT RTRN 9FT ADLT (ELECTROSURGICAL) ×1 IMPLANT
GAUZE XEROFORM 1X8 LF (GAUZE/BANDAGES/DRESSINGS) ×2 IMPLANT
GLOVE BIO SURGEON STRL SZ7.5 (GLOVE) ×2 IMPLANT
GLOVE BIOGEL PI IND STRL 8 (GLOVE) ×1 IMPLANT
GLOVE BIOGEL PI INDICATOR 8 (GLOVE) ×1
GLOVE ECLIPSE 8.0 STRL XLNG CF (GLOVE) ×2 IMPLANT
GOWN STRL REIN XL XLG (GOWN DISPOSABLE) ×2 IMPLANT
GUIDEWIRE ORTHO 0.054X6 (WIRE) ×6 IMPLANT
KIT BASIN OR (CUSTOM PROCEDURE TRAY) ×2 IMPLANT
NS IRRIG 1000ML POUR BTL (IV SOLUTION) ×2 IMPLANT
PACK LOWER EXTREMITY WL (CUSTOM PROCEDURE TRAY) ×2 IMPLANT
PAD CAST 3X4 CTTN HI CHSV (CAST SUPPLIES) ×1 IMPLANT
PAD CAST 4YDX4 CTTN HI CHSV (CAST SUPPLIES) IMPLANT
PADDING CAST COTTON 3X4 STRL (CAST SUPPLIES) ×1
PADDING CAST COTTON 4X4 STRL (CAST SUPPLIES)
PLATE ACULOCK 2 NARROW RT (Plate) ×2 IMPLANT
POSITIONER SURGICAL ARM (MISCELLANEOUS) ×2 IMPLANT
SCREW CORT FT 18X2.3XLCK HEX (Screw) ×1 IMPLANT
SCREW CORT FT 22X2.3XLCK HEX (Screw) ×2 IMPLANT
SCREW CORT FT 24X2.3XLCK HEX (Screw) ×1 IMPLANT
SCREW CORTICAL LOCKING 2.3X18M (Screw) ×1 IMPLANT
SCREW CORTICAL LOCKING 2.3X22M (Screw) ×4 IMPLANT
SCREW CORTICAL LOCKING 2.3X24M (Screw) ×2 IMPLANT
SCREW FX22X2.3XLCK SMTH NS CRT (Screw) ×2 IMPLANT
SCREW FX24X2.3XLCK SMTH NS CRT (Screw) ×1 IMPLANT
SCREW LOCK 12X3.5X HEXALOBE (Screw) ×1 IMPLANT
SCREW LOCKING 3.5X12 (Screw) ×1 IMPLANT
SCREW NONLOCK HEX 3.5X12 (Screw) ×4 IMPLANT
SPONGE GAUZE 4X4 12PLY (GAUZE/BANDAGES/DRESSINGS) ×2 IMPLANT
STRIP CLOSURE SKIN 1/2X4 (GAUZE/BANDAGES/DRESSINGS) ×2 IMPLANT
SUT MNCRL AB 4-0 PS2 18 (SUTURE) ×2 IMPLANT
SUT NYLON 3 0 (SUTURE) ×2 IMPLANT
SUT VIC AB 0 CT1 27 (SUTURE)
SUT VIC AB 0 CT1 27XBRD ANTBC (SUTURE) IMPLANT
SUT VIC AB 2-0 CT1 27 (SUTURE) ×1
SUT VIC AB 2-0 CT1 TAPERPNT 27 (SUTURE) ×1 IMPLANT
TOWEL OR 17X26 10 PK STRL BLUE (TOWEL DISPOSABLE) ×4 IMPLANT
TOWEL OR NON WOVEN STRL DISP B (DISPOSABLE) ×2 IMPLANT
UNDERPAD 30X30 INCONTINENT (UNDERPADS AND DIAPERS) ×2 IMPLANT
WATER STERILE IRR 1500ML POUR (IV SOLUTION) ×2 IMPLANT

## 2012-08-15 NOTE — Transfer of Care (Signed)
Immediate Anesthesia Transfer of Care Note  Patient: Emily Patterson  Procedure(s) Performed: Procedure(s): OPEN REDUCTION INTERNAL FIXATION (ORIF) RIGHT DISTAL RADIAL FRACTURE (Right)  Patient Location: PACU  Anesthesia Type:General  Level of Consciousness: awake, sedated and patient cooperative  Airway & Oxygen Therapy: Patient Spontanous Breathing and Patient connected to face mask oxygen  Post-op Assessment: Report given to PACU RN and Post -op Vital signs reviewed and stable  Post vital signs: Reviewed and stable  Complications: No apparent anesthesia complications

## 2012-08-15 NOTE — H&P (Signed)
Emily Patterson is an 57 y.o. female.   Chief Complaint:   Right wrist pain with known fracture HPI:   57 yo female who injured her right dominant wrist in a mechanical fall.  Was found to have a significantly displaced, unstable distal radius fracture and now presents for surgical fixation of this fracture.  Past Medical History  Diagnosis Date  . Hypertension   . Myocardial infarction 2010    followed by Dr. Earna Coder [facility]  . Leaky heart valve     followed by Dr. Earna Coder [facility]  . Radius fracture     Right    Past Surgical History  Procedure Laterality Date  . Tubal ligation    . Coronary angioplasty with stent placement  2010    History reviewed. No pertinent family history. Social History:  reports that she has been smoking Cigarettes.  She has been smoking about 0.00 packs per day. She does not have any smokeless tobacco history on file. She reports that she does not drink alcohol or use illicit drugs.  Allergies:  Allergies  Allergen Reactions  . Other Nausea And Vomiting    BLOOD PRESSURE CUFF: Causes nausea and vomiting, upset stomach  . Latex Rash    Medications Prior to Admission  Medication Sig Dispense Refill  . albuterol (PROAIR HFA) 108 (90 BASE) MCG/ACT inhaler Inhale 2 puffs into the lungs every 6 (six) hours as needed for wheezing or shortness of breath.       . ALPRAZolam (XANAX) 1 MG tablet Take 1 mg by mouth 3 (three) times daily.       Marland Kitchen aspirin EC 81 MG tablet Take 81 mg by mouth every morning.       . Cyanocobalamin (VITAMIN B-12 PO) Take 1 tablet by mouth every morning.      Marland Kitchen lisinopril (PRINIVIL,ZESTRIL) 5 MG tablet Take 5 mg by mouth every morning.      . metoprolol tartrate (LOPRESSOR) 25 MG tablet Take 25 mg by mouth 2 (two) times daily.      . pravastatin (PRAVACHOL) 80 MG tablet Take 80 mg by mouth at bedtime.       . ranitidine (ZANTAC) 150 MG tablet Take 150 mg by mouth as needed for heartburn.      . traMADol-acetaminophen  (ULTRACET) 37.5-325 MG per tablet Take 2 tablets by mouth every 6 (six) hours as needed for pain.      Marland Kitchen clopidogrel (PLAVIX) 75 MG tablet Take 75 mg by mouth every morning.       . mometasone (NASONEX) 50 MCG/ACT nasal spray Place 2 sprays into the nose daily as needed (For allergies.).       Marland Kitchen potassium chloride SA (K-DUR,KLOR-CON) 20 MEQ tablet Take 1 tablet (20 mEq total) by mouth 2 (two) times daily.  10 tablet  0  . triazolam (HALCION) 0.25 MG tablet Take 0.25 mg by mouth at bedtime as needed (For sleep.).        Results for orders placed during the hospital encounter of 08/15/12 (from the past 48 hour(s))  SURGICAL PCR SCREEN     Status: None   Collection Time    08/15/12  2:54 PM      Result Value Range   MRSA, PCR NEGATIVE  NEGATIVE   Staphylococcus aureus NEGATIVE  NEGATIVE   Comment:            The Xpert SA Assay (FDA     approved for NASAL specimens  in patients over 82 years of age),     is one component of     a comprehensive surveillance     program.  Test performance has     been validated by [employer] for patients greater     than or equal to 75 year old.     It is not intended     to diagnose infection nor to     guide or monitor treatment.  BASIC METABOLIC PANEL     Status: Abnormal   Collection Time    08/15/12  3:15 PM      Result Value Range   Sodium 139  135 - 145 mEq/L   Potassium 4.1  3.5 - 5.1 mEq/L   Chloride 103  96 - 112 mEq/L   CO2 30  19 - 32 mEq/L   Glucose, Bld 92  70 - 99 mg/dL   BUN 10  6 - 23 mg/dL   Creatinine, Ser 1.61  0.50 - 1.10 mg/dL   Calcium 9.5  8.4 - 09.6 mg/dL   GFR calc non Af Amer 75 (*) >90 mL/min   GFR calc Af Amer 87 (*) >90 mL/min   Comment:            The eGFR has been calculated     using the CKD EPI equation.     This calculation has not been     validated in all clinical     situations.     eGFR's persistently     <90 mL/min signify     possible Chronic Kidney Disease.  CBC     Status: Abnormal    Collection Time    08/15/12  3:15 PM      Result Value Range   WBC 4.7  4.0 - 10.5 K/uL   RBC 3.39 (*) 3.87 - 5.11 MIL/uL   Hemoglobin 10.7 (*) 12.0 - 15.0 g/dL   HCT 04.5 (*) 40.9 - 81.1 %   MCV 93.5  78.0 - 100.0 fL   MCH 31.6  26.0 - 34.0 pg   MCHC 33.8  30.0 - 36.0 g/dL   RDW 91.4  78.2 - 95.6 %   Platelets 169  150 - 400 K/uL   Dg Chest 2 View  08/15/2012   *RADIOLOGY REPORT*  Clinical Data: Preoperative respiratory films.  CHEST - 2 VIEW  Comparison: PA and lateral chest 05/04/2012.  Findings: Lungs are clear.  Right lower lobe airspace disease seen on the prior study has resolved.  Heart size is normal.  No pneumothorax or pleural fluid.  IMPRESSION: Resolved pneumonia.  No acute finding.   Original Report Authenticated By: Holley Dexter, M.D.    Review of Systems  All other systems reviewed and are negative.    Blood pressure 99/52, pulse 66, temperature 98.6 F (37 C), temperature source Oral, resp. rate 16, height 5\' 1"  (1.549 m), weight 66.906 kg (147 lb 8 oz), SpO2 98.00%. Physical Exam  Constitutional: She is oriented to person, place, and time. She appears well-developed and well-nourished.  HENT:  Head: Normocephalic and atraumatic.  Eyes: EOM are normal. Pupils are equal, round, and reactive to light.  Neck: Normal range of motion. Neck supple.  Cardiovascular: Normal rate and regular rhythm.   Respiratory: Effort normal and breath sounds normal.  GI: Soft. Bowel sounds are normal.  Musculoskeletal:       Right wrist: She exhibits decreased range of motion, tenderness, bony tenderness, swelling and deformity.  Neurological: She is alert and oriented to person, place, and time.  Skin: Skin is warm and dry.  Psychiatric: She has a normal mood and affect.     Assessment/Plan Displaced right distal radius fracture 1)  To the OR for open reduction/internal fixation of her right distal radius  Jhoselin Crume Y 08/15/2012, 7:25 PM

## 2012-08-15 NOTE — Anesthesia Preprocedure Evaluation (Addendum)
Anesthesia Evaluation  Patient identified by MRN, date of birth, ID band Patient awake    Reviewed: Allergy & Precautions, H&P , NPO status , Patient's Chart, lab work & pertinent test results  Airway Mallampati: III TM Distance: >3 FB Neck ROM: Full  Mouth opening: Limited Mouth Opening  Dental  (+) Poor Dentition, Missing and Implants   Pulmonary Current Smoker,  breath sounds clear to auscultation  Pulmonary exam normal       Cardiovascular hypertension, Pt. on medications and Pt. on home beta blockers + Past MI and + Cardiac Stents Rhythm:Regular Rate:Normal + Systolic murmurs On Plavix since stent placement in 2010   Neuro/Psych Anxiety negative neurological ROS     GI/Hepatic negative GI ROS, Neg liver ROS,   Endo/Other  negative endocrine ROS  Renal/GU negative Renal ROS  negative genitourinary   Musculoskeletal   Abdominal   Peds  Hematology negative hematology ROS (+)   Anesthesia Other Findings   Reproductive/Obstetrics negative OB ROS                          Anesthesia Physical Anesthesia Plan  ASA: III and emergent  Anesthesia Plan: General   Post-op Pain Management:    Induction: Intravenous  Airway Management Planned: LMA  Additional Equipment:   Intra-op Plan:   Post-operative Plan: Extubation in OR  Informed Consent: I have reviewed the patients History and Physical, chart, labs and discussed the procedure including the risks, benefits and alternatives for the proposed anesthesia with the patient or authorized representative who has indicated his/her understanding and acceptance.   Dental advisory given  Plan Discussed with: Anesthesiologist, CRNA and Surgeon  Anesthesia Plan Comments:        Anesthesia Quick Evaluation

## 2012-08-15 NOTE — Progress Notes (Signed)
Patient informed that Dr Magnus Ivan 's still in surgery with previous patient and her surgery will be delayed. She verbalizes understanding.

## 2012-08-15 NOTE — Anesthesia Postprocedure Evaluation (Signed)
  Anesthesia Post-op Note  Patient: Emily Patterson  Procedure(s) Performed: Procedure(s): OPEN REDUCTION INTERNAL FIXATION (ORIF) RIGHT DISTAL RADIAL FRACTURE (Right)  Patient Location: PACU  Anesthesia Type:General  Level of Consciousness: awake, alert  and oriented  Airway and Oxygen Therapy: Patient Spontanous Breathing  Post-op Pain: moderate  Post-op Assessment: Post-op Vital signs reviewed, Patient's Cardiovascular Status Stable, Respiratory Function Stable, Patent Airway, No signs of Nausea or vomiting and Pain level controlled  Post-op Vital Signs: Reviewed  Complications: No apparent anesthesia complications

## 2012-08-15 NOTE — Brief Op Note (Signed)
08/15/2012  10:04 PM  PATIENT:  Emily Patterson  57 y.o. female  PRE-OPERATIVE DIAGNOSIS:  Right distal radius fracture  POST-OPERATIVE DIAGNOSIS:  right distal radius fracture  PROCEDURE:  Procedure(s): OPEN REDUCTION INTERNAL FIXATION (ORIF) RIGHT DISTAL RADIAL FRACTURE (Right)  SURGEON:  Surgeon(s) and Role:    * Kathryne Hitch, MD - Primary  PHYSICIAN ASSISTANT:   ASSISTANTS: none   ANESTHESIA:   local and general  EBL:     BLOOD ADMINISTERED:none  DRAINS: none   LOCAL MEDICATIONS USED:  MARCAINE     SPECIMEN:  No Specimen  DISPOSITION OF SPECIMEN:  N/A  COUNTS:  YES  TOURNIQUET:   Total Tourniquet Time Documented: Upper Arm (Right) - 72 minutes Total: Upper Arm (Right) - 72 minutes   DICTATION: .Other Dictation: Dictation Number [id number]  PLAN OF CARE: Admit for overnight observation  PATIENT DISPOSITION:  PACU - hemodynamically stable.   Delay start of Pharmacological VTE agent (>24hrs) due to surgical blood loss or risk of bleeding: not applicable

## 2012-08-16 MED ORDER — CEFAZOLIN SODIUM 1-5 GM-% IV SOLN
1.0000 g | Freq: Four times a day (QID) | INTRAVENOUS | Status: AC
  Administered 2012-08-16 (×3): 1 g via INTRAVENOUS
  Filled 2012-08-16 (×3): qty 50

## 2012-08-16 MED ORDER — MORPHINE SULFATE 2 MG/ML IJ SOLN
2.0000 mg | INTRAMUSCULAR | Status: DC | PRN
  Administered 2012-08-16: 2 mg via INTRAVENOUS
  Filled 2012-08-16: qty 1

## 2012-08-16 MED ORDER — ZOLPIDEM TARTRATE 5 MG PO TABS
5.0000 mg | ORAL_TABLET | Freq: Every evening | ORAL | Status: DC | PRN
  Administered 2012-08-16: 5 mg via ORAL
  Filled 2012-08-16: qty 1

## 2012-08-16 MED ORDER — METOPROLOL TARTRATE 25 MG PO TABS
25.0000 mg | ORAL_TABLET | Freq: Two times a day (BID) | ORAL | Status: DC
  Administered 2012-08-16 – 2012-08-17 (×4): 25 mg via ORAL
  Filled 2012-08-16 (×5): qty 1

## 2012-08-16 MED ORDER — DIPHENHYDRAMINE HCL 12.5 MG/5ML PO ELIX: 12.5000 mg | ORAL_SOLUTION | ORAL | Status: DC | PRN

## 2012-08-16 MED ORDER — CLOPIDOGREL BISULFATE 75 MG PO TABS
75.0000 mg | ORAL_TABLET | Freq: Every morning | ORAL | Status: DC
  Administered 2012-08-16 – 2012-08-17 (×2): 75 mg via ORAL
  Filled 2012-08-16 (×2): qty 1

## 2012-08-16 MED ORDER — LISINOPRIL 5 MG PO TABS
5.0000 mg | ORAL_TABLET | Freq: Every morning | ORAL | Status: DC
  Administered 2012-08-16 – 2012-08-17 (×2): 5 mg via ORAL
  Filled 2012-08-16 (×2): qty 1

## 2012-08-16 MED ORDER — SODIUM CHLORIDE 0.9 % IV SOLN
INTRAVENOUS | Status: DC
  Administered 2012-08-16: 01:00:00 via INTRAVENOUS

## 2012-08-16 MED ORDER — ONDANSETRON HCL 4 MG/2ML IJ SOLN: 4.0000 mg | Freq: Four times a day (QID) | INTRAMUSCULAR | Status: DC | PRN

## 2012-08-16 MED ORDER — ZOLPIDEM TARTRATE 5 MG PO TABS
5.0000 mg | ORAL_TABLET | Freq: Once | ORAL | Status: AC
  Administered 2012-08-16: 5 mg via ORAL
  Filled 2012-08-16: qty 1

## 2012-08-16 MED ORDER — ASPIRIN EC 81 MG PO TBEC
81.0000 mg | DELAYED_RELEASE_TABLET | Freq: Every morning | ORAL | Status: DC
  Administered 2012-08-16 – 2012-08-17 (×2): 81 mg via ORAL
  Filled 2012-08-16 (×2): qty 1

## 2012-08-16 MED ORDER — ALPRAZOLAM 1 MG PO TABS
1.0000 mg | ORAL_TABLET | Freq: Three times a day (TID) | ORAL | Status: DC
  Administered 2012-08-16 – 2012-08-17 (×4): 1 mg via ORAL
  Filled 2012-08-16 (×4): qty 1

## 2012-08-16 MED ORDER — OXYCODONE HCL 5 MG PO TABS
5.0000 mg | ORAL_TABLET | ORAL | Status: DC | PRN
  Administered 2012-08-16 – 2012-08-17 (×6): 10 mg via ORAL
  Filled 2012-08-16 (×6): qty 2

## 2012-08-16 MED ORDER — MORPHINE SULFATE 2 MG/ML IJ SOLN
1.0000 mg | INTRAMUSCULAR | Status: DC | PRN
  Administered 2012-08-16 (×4): 1 mg via INTRAVENOUS
  Filled 2012-08-16 (×5): qty 1

## 2012-08-16 MED ORDER — METOCLOPRAMIDE HCL 5 MG/ML IJ SOLN: 5.0000 mg | Freq: Three times a day (TID) | INTRAMUSCULAR | Status: DC | PRN

## 2012-08-16 MED ORDER — ALBUTEROL SULFATE HFA 108 (90 BASE) MCG/ACT IN AERS
2.0000 | INHALATION_SPRAY | Freq: Four times a day (QID) | RESPIRATORY_TRACT | Status: DC | PRN
  Filled 2012-08-16: qty 6.7

## 2012-08-16 MED ORDER — ONDANSETRON HCL 4 MG PO TABS: 4.0000 mg | ORAL_TABLET | Freq: Four times a day (QID) | ORAL | Status: DC | PRN

## 2012-08-16 MED ORDER — TRIAZOLAM 0.25 MG PO TABS: 0.2500 mg | ORAL_TABLET | Freq: Every evening | ORAL | Status: DC | PRN

## 2012-08-16 MED ORDER — METOCLOPRAMIDE HCL 10 MG PO TABS: 5.0000 mg | ORAL_TABLET | Freq: Three times a day (TID) | ORAL | Status: DC | PRN

## 2012-08-16 MED ORDER — HYDROCODONE-ACETAMINOPHEN 5-325 MG PO TABS
1.0000 | ORAL_TABLET | ORAL | Status: DC | PRN
  Administered 2012-08-16 (×3): 2 via ORAL
  Filled 2012-08-16 (×3): qty 2

## 2012-08-16 NOTE — Op Note (Signed)
NAMECRYSTIN, Emily Patterson NO.:  [id number]  MEDICAL RECORD NO.:  [id number]  LOCATION:  1612                         FACILITY:  [facility]  PHYSICIAN:  Vanita Panda. Magnus Ivan, M.D.DATE OF BIRTH:  [birth year]  DATE OF PROCEDURE:  08/15/2012 DATE OF DISCHARGE:                              OPERATIVE REPORT   PREOPERATIVE DIAGNOSIS:  Right wrist extra-articular distal radius fracture with significant shortening and displacement.  POSTOPERATIVE DIAGNOSIS:  Right wrist extra-articular distal radius fracture with significant shortening and displacement.  PROCEDURE:  Open reduction and internal fixation of right distal radius fracture.  IMPLANTS:  Acumed distal radial volar locking plate.  SURGEON:  Vanita Panda. Magnus Ivan, M.D.  ANESTHESIA: 1. General. 2. Local with 0.25% plain Sensorcaine.  TOURNIQUET TIME:  Less than an hour and a half.  BLOOD LOSS:  Minimal.  COMPLICATIONS:  None.  INDICATIONS:  [name] is a 57 year old female who sometime in the last week fell off her porch steps breaking her right distal radius as her dominant wrist.  She was sent to me and recognizes she had a extra- articular distal radius fracture that was significantly shortened and displaced.  This is her dominant wrist.  We recommended open reduction and internal fixation.  PROCEDURE DESCRIPTION:  After informed consent was obtained, appropriate right wrist was marked.  She was brought to the operating room, placed supine on the operating table.  General anesthesia was obtained, the right arm was placed on radiolucent arm table.  Nonsterile tourniquet was placed on her upper right arm and her right hand, forearm, and wrist were prepped and draped with DuraPrep and sterile drapes.  Time-out was called to identify correct patient, correct right wrist.  I then used an Esmarch wrap out the wrist and tourniquet was inflated to 325 mm pressure.  I then made an incision directly over the  volar aspect of the distal radius into the volar approach of the wrist between the flexor carpi radialis, tendon, and radial artery.  I dissected down to the pronator quadratus into the distal radial ulnar direction and found a displaced distal radius fracture that was very distal.  After reducing the fracture, I chose a Acumed narrow distal plate and placed this on the distal radius on the volar surface.  I placed 2 bicortical screws proximally and then filled all the distal rows of radial styloid screws to hold the fracture reduced.  I then put the wrist to range of motion and it was stable.  I irrigated the soft tissues with normal saline solution.  I closed the subcutaneous tissue with interrupted 2-0 Vicryl, followed by interrupted 3-0 nylon on the skin.  I infiltrated the incision with 0.25% plain Sensorcaine.  Well-padded sterile dressings applied.  The tourniquet was let down.  The fingers pinked nicely.  Her Velcro wrist splint was also placed on top of the dressing.  She was awakened, extubated, and taken to the recovery room in stable condition. All final counts were correct and there were no complications noted.     Vanita Panda. Magnus Ivan, M.D.     CYB/MEDQ  D:  08/15/2012  T:  08/16/2012  Job:  [id number]

## 2012-08-16 NOTE — Progress Notes (Signed)
Subjective: Pt having pain in wrist right   Objective: Vital signs in last 24 hours: Temp:  [97.3 F (36.3 C)-99.3 F (37.4 C)] 99.3 F (37.4 C) (05/31 0602) Pulse Rate:  [64-76] 68 (05/31 0602) Resp:  [10-18] 16 (05/31 0602) BP: (99-169)/(52-102) 133/88 mmHg (05/31 0602) SpO2:  [94 %-100 %] 94 % (05/31 0602) Weight:  [66.906 kg (147 lb 8 oz)] 66.906 kg (147 lb 8 oz) (05/30 1450)  Intake/Output from previous day: 05/30 0701 - 05/31 0700 In: 1545 [P.O.:240; I.V.:1305] Out: 800 [Urine:800] Intake/Output this shift:    Exam:  No cellulitis present  Labs:  Recent Labs  08/15/12 1515  HGB 10.7*    Recent Labs  08/15/12 1515  WBC 4.7  RBC 3.39*  HCT 31.7*  PLT 169    Recent Labs  08/15/12 1515  NA 139  K 4.1  CL 103  CO2 30  BUN 10  CREATININE 0.84  GLUCOSE 92  CALCIUM 9.5   No results found for this basename: LABPT, INR,  in the last 72 hours  Assessment/Plan: Pt with pain in wrist sp orif - epl fpl io ok but paresthesias present - volar forearm compts soft - plan to elevate today with blue cradle boot and recheck am   DEAN,GREGORY SCOTT 08/16/2012, 9:31 AM

## 2012-08-17 NOTE — Progress Notes (Signed)
Subjective: Pt feels better today - ready for dc home   Objective: Vital signs in last 24 hours: Temp:  [98.7 F (37.1 C)-99.7 F (37.6 C)] 98.7 F (37.1 C) (06/01 0520) Pulse Rate:  [66-71] 66 (06/01 0520) Resp:  [16] 16 (06/01 0520) BP: (110-149)/(62-90) 110/62 mmHg (06/01 0520) SpO2:  [95 %-99 %] 95 % (06/01 0520)  Intake/Output from previous day: 05/31 0701 - 06/01 0700 In: 1351.8 [P.O.:300; I.V.:1051.8] Out: -  Intake/Output this shift:    Exam:  Intact pulses distally Compartment soft  Labs:  Recent Labs  08/15/12 1515  HGB 10.7*    Recent Labs  08/15/12 1515  WBC 4.7  RBC 3.39*  HCT 31.7*  PLT 169    Recent Labs  08/15/12 1515  NA 139  K 4.1  CL 103  CO2 30  BUN 10  CREATININE 0.84  GLUCOSE 92  CALCIUM 9.5   No results found for this basename: LABPT, INR,  in the last 72 hours  Assessment/Plan: Plan dc home today with cradle boot   DEAN,GREGORY SCOTT 08/17/2012, 10:39 AM

## 2012-08-17 NOTE — Progress Notes (Signed)
Discharged from floor via w/c, spouse with pt. No changes in assessment. Soila Printup   

## 2012-08-17 NOTE — Discharge Summary (Signed)
Patient ID: Emily Patterson MRN: [id number] DOB/AGE: 11-03-1955 57 y.o.  Admit date: 08/15/2012 Discharge date: 08/17/2012  Admission Diagnoses:  Principal Problem:   Fracture of right distal radius   Discharge Diagnoses:  Same  Past Medical History  Diagnosis Date  . Hypertension   . Myocardial infarction 2010    followed by Dr. Earna Coder [facility]  . Leaky heart valve     followed by Dr. Earna Coder [facility]  . Radius fracture     Right    Surgeries: Procedure(s): OPEN REDUCTION INTERNAL FIXATION (ORIF) RIGHT DISTAL RADIAL FRACTURE on 08/15/2012   Consultants:    Discharged Condition: Improved  Hospital Course: Emily Patterson is an 57 y.o. female who was admitted 08/15/2012 for operative treatment ofFracture of right distal radius. Patient has severe unremitting pain that affects sleep, daily activities, and work/hobbies. After pre-op clearance the patient was taken to the operating room on 08/15/2012 and underwent  Procedure(s): OPEN REDUCTION INTERNAL FIXATION (ORIF) RIGHT DISTAL RADIAL FRACTURE.    Patient was given perioperative antibiotics: Anti-infectives   Start     Dose/Rate Route Frequency Ordered Stop   08/16/12 0300  ceFAZolin (ANCEF) IVPB 1 g/50 mL premix     1 g 100 mL/hr over 30 Minutes Intravenous Every 6 hours 08/16/12 0018 08/16/12 1530   08/15/12 1515  ceFAZolin (ANCEF) IVPB 2 g/50 mL premix     2 g 100 mL/hr over 30 Minutes Intravenous On call to O.R. 08/15/12 1453 08/15/12 2030       Patient was given sequential compression devices, early ambulation, and chemoprophylaxis to prevent DVT.  Patient benefited maximally from hospital stay and there were no complications.    Recent vital signs: Patient Vitals for the past 24 hrs:  BP Temp Temp src Pulse Resp SpO2  08/17/12 0520 110/62 mmHg 98.7 F (37.1 C) Oral 66 16 95 %  08/16/12 2040 149/74 mmHg 99.6 F (37.6 C) Oral 71 16 99 %     Recent laboratory studies:  Recent Labs  08/15/12 1515   WBC 4.7  HGB 10.7*  HCT 31.7*  PLT 169  NA 139  K 4.1  CL 103  CO2 30  BUN 10  CREATININE 0.84  GLUCOSE 92  CALCIUM 9.5     Discharge Medications:     Medication List    TAKE these medications       ALPRAZolam 1 MG tablet  Commonly known as:  XANAX  Take 1 mg by mouth 3 (three) times daily.     aspirin EC 81 MG tablet  Take 81 mg by mouth every morning.     clopidogrel 75 MG tablet  Commonly known as:  PLAVIX  Take 75 mg by mouth every morning.     lisinopril 5 MG tablet  Commonly known as:  PRINIVIL,ZESTRIL  Take 5 mg by mouth every morning.     methocarbamol 500 MG tablet  Commonly known as:  ROBAXIN  Take 1 tablet (500 mg total) by mouth 4 (four) times daily as needed.     metoprolol tartrate 25 MG tablet  Commonly known as:  LOPRESSOR  Take 25 mg by mouth 2 (two) times daily.     mometasone 50 MCG/ACT nasal spray  Commonly known as:  NASONEX  Place 2 sprays into the nose daily as needed (For allergies.).     oxyCODONE-acetaminophen 5-325 MG per tablet  Commonly known as:  ROXICET  Take 1-2 tablets by mouth every 4 (four) hours as  needed for pain.     potassium chloride SA 20 MEQ tablet  Commonly known as:  K-DUR,KLOR-CON  Take 1 tablet (20 mEq total) by mouth 2 (two) times daily.     pravastatin 80 MG tablet  Commonly known as:  PRAVACHOL  Take 80 mg by mouth at bedtime.     PROAIR HFA 108 (90 BASE) MCG/ACT inhaler  Generic drug:  albuterol  Inhale 2 puffs into the lungs every 6 (six) hours as needed for wheezing or shortness of breath.     ranitidine 150 MG tablet  Commonly known as:  ZANTAC  Take 150 mg by mouth as needed for heartburn.     traMADol-acetaminophen 37.5-325 MG per tablet  Commonly known as:  ULTRACET  Take 2 tablets by mouth every 6 (six) hours as needed for pain.     triazolam 0.25 MG tablet  Commonly known as:  HALCION  Take 0.25 mg by mouth at bedtime as needed (For sleep.).     VITAMIN B-12 PO  Take 1 tablet by  mouth every morning.        Diagnostic Studies: Dg Chest 2 View  08/15/2012   *RADIOLOGY REPORT*  Clinical Data: Preoperative respiratory films.  CHEST - 2 VIEW  Comparison: PA and lateral chest 05/04/2012.  Findings: Lungs are clear.  Right lower lobe airspace disease seen on the prior study has resolved.  Heart size is normal.  No pneumothorax or pleural fluid.  IMPRESSION: Resolved pneumonia.  No acute finding.   Original Report Authenticated By: Holley Dexter, M.D.    Disposition: 01-Home or Self Care      Discharge Orders   Future Orders Complete By Expires     Call MD / Call 911  As directed     Comments:      If you experience chest pain or shortness of breath, CALL 911 and be transported to the hospital emergency room.  If you develope a fever above 101 F, pus (white drainage) or increased drainage or redness at the wound, or calf pain, call your surgeon's office.    Call MD / Call 911  As directed     Comments:      If you experience chest pain or shortness of breath, CALL 911 and be transported to the hospital emergency room.  If you develope a fever above 101 F, pus (white drainage) or increased drainage or redness at the wound, or calf pain, call your surgeon's office.    Constipation Prevention  As directed     Comments:      Drink plenty of fluids.  Prune juice may be helpful.  You may use a stool softener, such as Colace (over the counter) 100 mg twice a day.  Use MiraLax (over the counter) for constipation as needed.    Constipation Prevention  As directed     Comments:      Drink plenty of fluids.  Prune juice may be helpful.  You may use a stool softener, such as Colace (over the counter) 100 mg twice a day.  Use MiraLax (over the counter) for constipation as needed.    Diet - low sodium heart healthy  As directed     Diet - low sodium heart healthy  As directed     Discharge instructions  As directed     Comments:      Elevation and ice for swelling You can  remove your dressing in 4 days and get your incision wet in  the shower; then apply new dry dressing or large band-aids daily    Discharge instructions  As directed     Comments:      Elevate hand and move fingers - keep splint dry    Increase activity slowly as tolerated  As directed     Increase activity slowly as tolerated  As directed        Follow-up Information   Follow up with Kathryne Hitch, MD In 2 weeks.   Contact information:   [address] [place] [address] [place] [address] [phone]        Signed: Kathryne Hitch 08/17/2012, 2:35 PM

## 2012-08-18 ENCOUNTER — Encounter (HOSPITAL_COMMUNITY): Payer: Self-pay | Admitting: Orthopaedic Surgery

## 2013-02-01 ENCOUNTER — Encounter (HOSPITAL_COMMUNITY): Payer: Self-pay | Admitting: Emergency Medicine

## 2013-02-01 ENCOUNTER — Emergency Department (HOSPITAL_COMMUNITY)
Admission: EM | Admit: 2013-02-01 | Discharge: 2013-02-01 | Disposition: A | Discharge status: Home / Self Care | Payer: Medicaid Other | Attending: Emergency Medicine | Admitting: Emergency Medicine

## 2013-02-01 DIAGNOSIS — Z8781 Personal history of (healed) traumatic fracture: Secondary | ICD-10-CM | POA: Insufficient documentation

## 2013-02-01 DIAGNOSIS — I10 Essential (primary) hypertension: Secondary | ICD-10-CM | POA: Insufficient documentation

## 2013-02-01 DIAGNOSIS — Z7982 Long term (current) use of aspirin: Secondary | ICD-10-CM | POA: Insufficient documentation

## 2013-02-01 DIAGNOSIS — I252 Old myocardial infarction: Secondary | ICD-10-CM | POA: Insufficient documentation

## 2013-02-01 DIAGNOSIS — Z79899 Other long term (current) drug therapy: Secondary | ICD-10-CM | POA: Insufficient documentation

## 2013-02-01 DIAGNOSIS — K089 Disorder of teeth and supporting structures, unspecified: Secondary | ICD-10-CM | POA: Insufficient documentation

## 2013-02-01 DIAGNOSIS — K0889 Other specified disorders of teeth and supporting structures: Secondary | ICD-10-CM

## 2013-02-01 DIAGNOSIS — R079 Chest pain, unspecified: Secondary | ICD-10-CM | POA: Insufficient documentation

## 2013-02-01 DIAGNOSIS — F17200 Nicotine dependence, unspecified, uncomplicated: Secondary | ICD-10-CM | POA: Insufficient documentation

## 2013-02-01 DIAGNOSIS — Z91040 Latex allergy status: Secondary | ICD-10-CM | POA: Insufficient documentation

## 2013-02-01 MED ORDER — HYDROCODONE-ACETAMINOPHEN 5-325 MG PO TABS
2.0000 | ORAL_TABLET | Freq: Once | ORAL | Status: AC
  Administered 2013-02-01: 2 via ORAL
  Filled 2013-02-01: qty 2

## 2013-02-01 MED ORDER — HYDROXYZINE HCL 25 MG PO TABS
25.0000 mg | ORAL_TABLET | Freq: Once | ORAL | Status: AC
  Administered 2013-02-01: 25 mg via ORAL
  Filled 2013-02-01: qty 1

## 2013-02-01 MED ORDER — HYDROCODONE-ACETAMINOPHEN 10-325 MG PO TABS: 1.0000 | ORAL_TABLET | Freq: Four times a day (QID) | ORAL | Status: DC | PRN

## 2013-02-01 MED ORDER — KETOROLAC TROMETHAMINE 60 MG/2ML IM SOLN: 60.0000 mg | Freq: Once | INTRAMUSCULAR | Status: DC

## 2013-02-01 NOTE — ED Notes (Signed)
I had 6 teeth removed and they gave me pills to take and they are not working per pt. I have been in pain since I left the office on Thursday per pt.

## 2013-02-01 NOTE — ED Provider Notes (Signed)
CSN: [id number]     Arrival date & time 02/01/13  2120 History   First MD Initiated Contact with Patient 02/01/13 2153     Chief Complaint  Patient presents with  . Dental Pain   (Consider location/radiation/quality/duration/timing/severity/associated sxs/prior Treatment) HPI Comments: Pt reports having 6 teeth removed Thursday, November 13. The patient states she was given 6 pills of hydrocodone, and has been in pain since the extraction. The patient states she cannot take ibuprofen. She has been on penicillin for nearly a week. The patient states that she was up crying most of the night Friday night and most of the day today. She presents to the emergency department because she states she just cannot take the pain anymore. She has been unable to reach her dentist.  Patient is a 57 y.o. female presenting with tooth pain. The history is provided by the patient.  Dental Pain Associated symptoms: no neck pain     Past Medical History  Diagnosis Date  . Hypertension   . Myocardial infarction 2010    followed by Dr. Earna Coder [facility]  . Leaky heart valve     followed by Dr. Earna Coder [facility]  . Radius fracture     Right   Past Surgical History  Procedure Laterality Date  . Tubal ligation    . Coronary angioplasty with stent placement  2010  . Open reduction internal fixation (orif) distal radial fracture Right 08/15/2012    Procedure: OPEN REDUCTION INTERNAL FIXATION (ORIF) RIGHT DISTAL RADIAL FRACTURE;  Surgeon: Kathryne Hitch, MD;  Location: [facility];  Service: Orthopedics;  Laterality: Right;   History reviewed. No pertinent family history. History  Substance Use Topics  . Smoking status: Current Every Day Smoker    Types: Cigarettes  . Smokeless tobacco: Not on file  . Alcohol Use: No   OB History   Grav Para Term Preterm Abortions TAB SAB Ect Mult Living                 Review of Systems  Constitutional: Negative for activity change.       All ROS Neg  except as noted in HPI  HENT: Positive for dental problem. Negative for nosebleeds.   Eyes: Negative for photophobia and discharge.  Respiratory: Negative for cough, shortness of breath and wheezing.   Cardiovascular: Positive for chest pain. Negative for palpitations.  Gastrointestinal: Negative for abdominal pain and blood in stool.  Genitourinary: Negative for dysuria, frequency and hematuria.  Musculoskeletal: Negative for arthralgias, back pain and neck pain.  Skin: Negative.   Neurological: Negative for dizziness, seizures and speech difficulty.  Psychiatric/Behavioral: Negative for hallucinations and confusion.    Allergies  Ibuprofen; Other; Ultram; and Latex  Home Medications   Current Outpatient Rx  Name  Route  Sig  Dispense  Refill  . albuterol (PROAIR HFA) 108 (90 BASE) MCG/ACT inhaler   Inhalation   Inhale 2 puffs into the lungs every 6 (six) hours as needed for wheezing or shortness of breath.          . ALPRAZolam (XANAX) 1 MG tablet   Oral   Take 1 mg by mouth 3 (three) times daily.          Marland Kitchen aspirin EC 81 MG tablet   Oral   Take 81 mg by mouth every morning.          . clopidogrel (PLAVIX) 75 MG tablet   Oral   Take 75 mg by mouth every  morning.          . Cyanocobalamin (VITAMIN B-12 PO)   Oral   Take 1 tablet by mouth every morning.         Marland Kitchen lisinopril (PRINIVIL,ZESTRIL) 5 MG tablet   Oral   Take 5 mg by mouth every morning.         . methocarbamol (ROBAXIN) 500 MG tablet   Oral   Take 1 tablet (500 mg total) by mouth 4 (four) times daily as needed.   60 tablet   0   . metoprolol tartrate (LOPRESSOR) 25 MG tablet   Oral   Take 25 mg by mouth 2 (two) times daily.         . mometasone (NASONEX) 50 MCG/ACT nasal spray   Nasal   Place 2 sprays into the nose daily as needed (For allergies.).          Marland Kitchen oxyCODONE-acetaminophen (ROXICET) 5-325 MG per tablet   Oral   Take 1-2 tablets by mouth every 4 (four) hours as needed for  pain.   60 tablet   0   . potassium chloride SA (K-DUR,KLOR-CON) 20 MEQ tablet   Oral   Take 1 tablet (20 mEq total) by mouth 2 (two) times daily.   10 tablet   0   . pravastatin (PRAVACHOL) 80 MG tablet   Oral   Take 80 mg by mouth at bedtime.          . ranitidine (ZANTAC) 150 MG tablet   Oral   Take 150 mg by mouth as needed for heartburn.         . traMADol-acetaminophen (ULTRACET) 37.5-325 MG per tablet   Oral   Take 2 tablets by mouth every 6 (six) hours as needed for pain.         Marland Kitchen triazolam (HALCION) 0.25 MG tablet   Oral   Take 0.25 mg by mouth at bedtime as needed (For sleep.).          BP 111/69  Pulse 71  Temp(Src) 97.5 F (36.4 C) (Oral)  Resp 20  Ht 5\' 1"  (1.549 m)  Wt 140 lb (63.504 kg)  BMI 26.47 kg/m2  SpO2 100% Physical Exam  Nursing note and vitals reviewed. Constitutional: She is oriented to person, place, and time. She appears well-developed and well-nourished.  Non-toxic appearance.  HENT:  Head: Normocephalic.  Right Ear: Tympanic membrane and external ear normal.  Left Ear: Tympanic membrane and external ear normal.  There is swelling of the lower jaw at the extraction site. No significant drainage. No swelling under the tongue. Airway patent.  Eyes: EOM and lids are normal. Pupils are equal, round, and reactive to light.  Neck: Normal range of motion. Neck supple. Carotid bruit is not present.  Cardiovascular: Normal rate, regular rhythm, normal heart sounds, intact distal pulses and normal pulses.   Pulmonary/Chest: Breath sounds normal. No respiratory distress.  Abdominal: Soft. Bowel sounds are normal. There is no tenderness. There is no guarding.  Musculoskeletal: Normal range of motion.  Lymphadenopathy:       Head (right side): No submandibular adenopathy present.       Head (left side): No submandibular adenopathy present.    She has no cervical adenopathy.  Neurological: She is alert and oriented to person, place, and  time. She has normal strength. No cranial nerve deficit or sensory deficit.  Skin: Skin is warm and dry.  Psychiatric: She has a normal mood and affect. Her speech is  normal.    ED Course  Procedures (including critical care time) Labs Review Labs Reviewed - No data to display Imaging Review No results found.  EKG Interpretation   None       MDM  No diagnosis found. *I have reviewed nursing notes, vital signs, and all appropriate lab and imaging results for this patient.  Patient had 6 teeth removed on November 13. She states the pain medicine and antibiotic she is taking is not helping. She is on Plavix, and is unable to take nonsteroidal anti-inflammatory medications.  No gross abscess noted at the postsurgical site. No swelling under the tongue. No evidence for Ludwig's angina. The airway is patent.  Prescription for Norco 10/325 given to the patient at patient's request. Patient advised to see her dentist as sone as possible for management of the postoperative pain. She will continue the penicillin for now.  Kathie Dike, PA-C 02/01/13 2244

## 2013-02-03 NOTE — ED Provider Notes (Signed)
Medical screening examination/treatment/procedure(s) were performed by non-physician practitioner and as supervising physician I was immediately available for consultation/collaboration.  EKG Interpretation   None        Nimesh Riolo, MD 02/03/13 1049 

## 2013-05-09 ENCOUNTER — Encounter (HOSPITAL_COMMUNITY): Payer: Self-pay | Admitting: Emergency Medicine

## 2013-05-09 ENCOUNTER — Emergency Department (HOSPITAL_COMMUNITY)
Admission: EM | Admit: 2013-05-09 | Discharge: 2013-05-09 | Disposition: A | Discharge status: Home / Self Care | Payer: Medicaid Other | Attending: Emergency Medicine | Admitting: Emergency Medicine

## 2013-05-09 DIAGNOSIS — Z7982 Long term (current) use of aspirin: Secondary | ICD-10-CM | POA: Insufficient documentation

## 2013-05-09 DIAGNOSIS — Z91040 Latex allergy status: Secondary | ICD-10-CM | POA: Insufficient documentation

## 2013-05-09 DIAGNOSIS — K0889 Other specified disorders of teeth and supporting structures: Secondary | ICD-10-CM

## 2013-05-09 DIAGNOSIS — I10 Essential (primary) hypertension: Secondary | ICD-10-CM | POA: Insufficient documentation

## 2013-05-09 DIAGNOSIS — I080 Rheumatic disorders of both mitral and aortic valves: Secondary | ICD-10-CM | POA: Insufficient documentation

## 2013-05-09 DIAGNOSIS — Z9889 Other specified postprocedural states: Secondary | ICD-10-CM | POA: Insufficient documentation

## 2013-05-09 DIAGNOSIS — I252 Old myocardial infarction: Secondary | ICD-10-CM | POA: Insufficient documentation

## 2013-05-09 DIAGNOSIS — Z7902 Long term (current) use of antithrombotics/antiplatelets: Secondary | ICD-10-CM | POA: Insufficient documentation

## 2013-05-09 DIAGNOSIS — Z79899 Other long term (current) drug therapy: Secondary | ICD-10-CM | POA: Insufficient documentation

## 2013-05-09 DIAGNOSIS — F17200 Nicotine dependence, unspecified, uncomplicated: Secondary | ICD-10-CM | POA: Insufficient documentation

## 2013-05-09 DIAGNOSIS — Z8781 Personal history of (healed) traumatic fracture: Secondary | ICD-10-CM | POA: Insufficient documentation

## 2013-05-09 DIAGNOSIS — K089 Disorder of teeth and supporting structures, unspecified: Secondary | ICD-10-CM | POA: Insufficient documentation

## 2013-05-09 DIAGNOSIS — IMO0002 Reserved for concepts with insufficient information to code with codable children: Secondary | ICD-10-CM | POA: Insufficient documentation

## 2013-05-09 MED ORDER — HYDROCODONE-ACETAMINOPHEN 5-325 MG PO TABS: 1.0000 | ORAL_TABLET | ORAL | Status: DC | PRN

## 2013-05-09 NOTE — ED Notes (Addendum)
Pt had maxillofacial surgery to remove tooth fragments from lower front jaw on Thursday, pt states she was prescribed percocet 5/325mg  (20 qty), and Ibuprofen 800mg  (10 qty). Pt states the swelling and pain is worse and she is almost out of medicine. Pt states the pain medicine is not and has not worked for her pain.

## 2013-05-09 NOTE — ED Provider Notes (Signed)
Medical screening examination/treatment/procedure(s) were performed by non-physician practitioner and as supervising physician I was immediately available for consultation/collaboration.  EKG Interpretation   None       Of note, I recommended if there is no concern for infection on exam and her surgeon did not put on abx and it is only a pain control/out of pain meds issue then antibiotics would not be necessary.  Audree CamelScott T Madsen Riddle, MD 05/09/13 [phone]

## 2013-05-09 NOTE — ED Notes (Signed)
Pt alert & oriented x4, stable gait. Patient given discharge instructions, paperwork & prescription(s). Patient  instructed to stop at the registration desk to finish any additional paperwork. Patient verbalized understanding. Pt left department w/ no further questions. 

## 2013-05-09 NOTE — Discharge Instructions (Signed)
Call for a follow up appointment with a Family or Primary Care Provider.  Call your oral surgeon for further evaluation of your lower jaw pain. Return if Symptoms worsen.   Take medication as prescribed.

## 2013-05-09 NOTE — ED Notes (Signed)
Pt discharged with pain RX, pt pleased with her care

## 2013-05-09 NOTE — ED Provider Notes (Signed)
CSN: [id number]     Arrival date & time 05/09/13  4098 History   First MD Initiated Contact with Patient 05/09/13 [phone]     Chief Complaint  Patient presents with  . Dental Pain     (Consider location/radiation/quality/duration/timing/severity/associated sxs/prior Treatment) HPI Comments: Emily Patterson is a 58 year-old female with a past medical history of HTN, MI, tobacco abuse, presenting the Emergency Department with a chief complaint of lower jaw pain. Pt reports having 6 "teeth fragments" in her lower jaw removed on 04/05/2013. The patient states she was given 20 pills of oxycodone and Ibuprofen, and has been in pain since the extraction. The patient reports the oral surgeon's office was not open on Friday when she called.  Denies fever or chills.  Patient is a 58 y.o. female presenting with tooth pain. The history is provided by the patient and medical records. No language interpreter was used.  Dental Pain Associated symptoms: no drooling, no facial swelling, no fever and no oral lesions     Past Medical History  Diagnosis Date  . Hypertension   . Myocardial infarction 2010    followed by Dr. Earna Coder [facility]  . Leaky heart valve     followed by Dr. Earna Coder [facility]  . Radius fracture     Right   Past Surgical History  Procedure Laterality Date  . Tubal ligation    . Coronary angioplasty with stent placement  2010  . Open reduction internal fixation (orif) distal radial fracture Right 08/15/2012    Procedure: OPEN REDUCTION INTERNAL FIXATION (ORIF) RIGHT DISTAL RADIAL FRACTURE;  Surgeon: Kathryne Hitch, MD;  Location: [facility];  Service: Orthopedics;  Laterality: Right;   History reviewed. No pertinent family history. History  Substance Use Topics  . Smoking status: Current Every Day Smoker    Types: Cigarettes  . Smokeless tobacco: Not on file  . Alcohol Use: No   OB History   Grav Para Term Preterm Abortions TAB SAB Ect Mult Living                  Review of Systems  Constitutional: Negative for fever and chills.  HENT: Positive for dental problem. Negative for drooling, facial swelling, mouth sores, trouble swallowing and voice change.   Gastrointestinal: Negative for nausea and vomiting.  Skin: Negative for rash.      Allergies  Other; Ultram; and Latex  Home Medications   Current Outpatient Rx  Name  Route  Sig  Dispense  Refill  . albuterol (PROAIR HFA) 108 (90 BASE) MCG/ACT inhaler   Inhalation   Inhale 2 puffs into the lungs every 6 (six) hours as needed for wheezing or shortness of breath.          . ALPRAZolam (XANAX) 1 MG tablet   Oral   Take 1 mg by mouth 3 (three) times daily.          Marland Kitchen aspirin EC 81 MG tablet   Oral   Take 81 mg by mouth every morning.          . clopidogrel (PLAVIX) 75 MG tablet   Oral   Take 75 mg by mouth every morning.          . Cyanocobalamin (VITAMIN B-12 PO)   Oral   Take 1 tablet by mouth every morning.         Marland Kitchen HYDROcodone-acetaminophen (NORCO) 10-325 MG per tablet   Oral   Take 1 tablet by mouth  every 6 (six) hours as needed.   15 tablet   0   . lisinopril (PRINIVIL,ZESTRIL) 5 MG tablet   Oral   Take 5 mg by mouth every morning.         . methocarbamol (ROBAXIN) 500 MG tablet   Oral   Take 1 tablet (500 mg total) by mouth 4 (four) times daily as needed.   60 tablet   0   . metoprolol tartrate (LOPRESSOR) 25 MG tablet   Oral   Take 25 mg by mouth 2 (two) times daily.         . mometasone (NASONEX) 50 MCG/ACT nasal spray   Nasal   Place 2 sprays into the nose daily as needed (For allergies.).          Marland Kitchen. oxyCODONE-acetaminophen (ROXICET) 5-325 MG per tablet   Oral   Take 1-2 tablets by mouth every 4 (four) hours as needed for pain.   60 tablet   0   . potassium chloride SA (K-DUR,KLOR-CON) 20 MEQ tablet   Oral   Take 1 tablet (20 mEq total) by mouth 2 (two) times daily.   10 tablet   0   . pravastatin (PRAVACHOL) 80 MG tablet    Oral   Take 80 mg by mouth at bedtime.          . ranitidine (ZANTAC) 150 MG tablet   Oral   Take 150 mg by mouth as needed for heartburn.         . traMADol-acetaminophen (ULTRACET) 37.5-325 MG per tablet   Oral   Take 2 tablets by mouth every 6 (six) hours as needed for pain.         Marland Kitchen. triazolam (HALCION) 0.25 MG tablet   Oral   Take 0.25 mg by mouth at bedtime as needed (For sleep.).          BP 117/78  Pulse 68  Temp(Src) 98.8 F (37.1 C) (Oral)  Resp 18  Ht 5\' 1"  (1.549 m)  Wt 140 lb (63.504 kg)  BMI 26.47 kg/m2  SpO2 99% Physical Exam  Nursing note and vitals reviewed. Constitutional: She appears well-developed.  HENT:  Head: Normocephalic and atraumatic.  Mouth/Throat: Mucous membranes are not dry. No trismus in the jaw. Abnormal dentition. No oropharyngeal exudate, posterior oropharyngeal edema or posterior oropharyngeal erythema.  Edentulous lower jaw.  Vicrly sutures in place.  No obvious abscess or drainage noted. Floor of mouth soft, no crepitus or erythema noted.  Neck: Normal range of motion. Neck supple.  Pulmonary/Chest: Effort normal and breath sounds normal. No respiratory distress.    ED Course  Procedures (including critical care time) Labs Review Labs Reviewed - No data to display Imaging Review No results found.  EKG Interpretation   None       MDM   Final diagnoses:  Pain, dental   Pt presents 3 days after a dental procedure with lower jaw pain.  Afebrile, No gross abscess, no trissmiss.  Exam unconcerning for Ludwig's angina or spread of infection.  Will treat pain medicine.  Urged patient to follow-up with dentist. Discussed patient history and condition with Dr. Criss AlvineGoldston who advises against antibiotics at this time and follow up with her oral surgeon. Discussed treatment plan with the patient. Return precautions given. Reports understanding and no other concerns at this time.  Patient is stable for discharge at this time.  Meds  given in ED:  Medications - No data to display  New Prescriptions  HYDROCODONE-ACETAMINOPHEN (NORCO/VICODIN) 5-325 MG PER TABLET    Take 1 tablet by mouth every 4 (four) hours as needed.        Clabe Seal, PA-C 05/09/13 [phone]

## 2013-06-27 ENCOUNTER — Emergency Department (HOSPITAL_COMMUNITY)
Admission: EM | Admit: 2013-06-27 | Discharge: 2013-06-27 | Discharge status: Against Medical Advice | Payer: Medicaid Other | Attending: Emergency Medicine | Admitting: Emergency Medicine

## 2013-06-27 ENCOUNTER — Encounter (HOSPITAL_COMMUNITY): Payer: Self-pay | Admitting: Emergency Medicine

## 2013-06-27 DIAGNOSIS — I252 Old myocardial infarction: Secondary | ICD-10-CM | POA: Insufficient documentation

## 2013-06-27 DIAGNOSIS — I10 Essential (primary) hypertension: Secondary | ICD-10-CM | POA: Insufficient documentation

## 2013-06-27 DIAGNOSIS — M549 Dorsalgia, unspecified: Secondary | ICD-10-CM | POA: Insufficient documentation

## 2013-06-27 DIAGNOSIS — F17200 Nicotine dependence, unspecified, uncomplicated: Secondary | ICD-10-CM | POA: Insufficient documentation

## 2013-06-27 NOTE — ED Notes (Signed)
Pt decided she did not want to wait.  Ambulated from department with no difficulty.

## 2013-06-27 NOTE — ED Notes (Signed)
Pt reports lifting an air conditioner yesterday.  Reports pain in lower back.  Reports that she did take a pain pill and a muscle relaxer this afternoon with no relief.

## 2013-06-28 ENCOUNTER — Emergency Department (HOSPITAL_COMMUNITY)
Admission: EM | Admit: 2013-06-28 | Discharge: 2013-06-28 | Disposition: A | Discharge status: Home / Self Care | Payer: Medicaid Other | Attending: Emergency Medicine | Admitting: Emergency Medicine

## 2013-06-28 ENCOUNTER — Encounter (HOSPITAL_COMMUNITY): Payer: Self-pay | Admitting: Emergency Medicine

## 2013-06-28 DIAGNOSIS — Z7982 Long term (current) use of aspirin: Secondary | ICD-10-CM | POA: Insufficient documentation

## 2013-06-28 DIAGNOSIS — M5430 Sciatica, unspecified side: Secondary | ICD-10-CM | POA: Insufficient documentation

## 2013-06-28 DIAGNOSIS — Z91040 Latex allergy status: Secondary | ICD-10-CM | POA: Insufficient documentation

## 2013-06-28 DIAGNOSIS — I252 Old myocardial infarction: Secondary | ICD-10-CM | POA: Insufficient documentation

## 2013-06-28 DIAGNOSIS — Z9861 Coronary angioplasty status: Secondary | ICD-10-CM | POA: Insufficient documentation

## 2013-06-28 DIAGNOSIS — F17200 Nicotine dependence, unspecified, uncomplicated: Secondary | ICD-10-CM | POA: Insufficient documentation

## 2013-06-28 DIAGNOSIS — Z79899 Other long term (current) drug therapy: Secondary | ICD-10-CM | POA: Insufficient documentation

## 2013-06-28 DIAGNOSIS — Z8781 Personal history of (healed) traumatic fracture: Secondary | ICD-10-CM | POA: Insufficient documentation

## 2013-06-28 DIAGNOSIS — I10 Essential (primary) hypertension: Secondary | ICD-10-CM | POA: Insufficient documentation

## 2013-06-28 DIAGNOSIS — Z7902 Long term (current) use of antithrombotics/antiplatelets: Secondary | ICD-10-CM | POA: Insufficient documentation

## 2013-06-28 MED ORDER — NAPROXEN 375 MG PO TABS: 375.0000 mg | ORAL_TABLET | Freq: Two times a day (BID) | ORAL | Status: DC

## 2013-06-28 MED ORDER — HYDROCODONE-ACETAMINOPHEN 5-325 MG PO TABS: 1.0000 | ORAL_TABLET | ORAL | Status: DC | PRN

## 2013-06-28 MED ORDER — OXYCODONE-ACETAMINOPHEN 5-325 MG PO TABS
1.0000 | ORAL_TABLET | Freq: Once | ORAL | Status: AC
  Administered 2013-06-28: 1 via ORAL
  Filled 2013-06-28: qty 1

## 2013-06-28 MED ORDER — CYCLOBENZAPRINE HCL 10 MG PO TABS
5.0000 mg | ORAL_TABLET | Freq: Once | ORAL | Status: AC
  Administered 2013-06-28: 5 mg via ORAL
  Filled 2013-06-28: qty 1

## 2013-06-28 NOTE — ED Provider Notes (Signed)
CSN: [id number]     Arrival date & time 06/28/13  1137 History  This chart was scribed for non-physician practitioner, Kerrie Buffalo, FNP,working with Benny Lennert, MD, by Karle Plumber, ED Scribe.  This patient was seen in room APFT20/APFT20 and the patient's care was started at 1:32 PM.  Chief Complaint  Patient presents with  . Back Pain   The history is provided by the patient. No language interpreter was used.   HPI Comments:  Emily Patterson is a 58 y.o. female with h/o HTN who presents to the Emergency Department complaining of severe worsening lower back pain that radiates down into her left hip that started two days ago secondary to lifting an air conditioning unit. She states she felt a pulling sensation in her lower back immediately, but thought it was normal. Pt reports her pain as 10/10. Pt reports taking a Flexeril approximately six hours ago with no relief. She denies weakness, numbness or tingling to the lower extremities, CP, SOB, urinary symptoms, and bowel or bladder incontinence.   Past Medical History  Diagnosis Date  . Hypertension   . Myocardial infarction 2010    [facility]arna [facility]  . Leaky heart valve     follow[facility] by Dr. Earna Coder [facility]  . Radius fracture     Right   Past Surgical History  Procedure Laterality Date  . Tubal ligation    . Coronary angioplasty with stent placement  2010  . Open reduction internal fixation (orif) distal radial fracture Right 08/15/2012    Procedure: OPEN REDUCTION INTERNAL FIXATION (ORIF) RIGHT DISTAL RADIAL FRACTURE;  Surgeon: Kathryne Hitch, MD;  Location: [facility];  Service: Orthopedics;  Laterality: Right;   History reviewed. No pertinent family history. History  Substance Use Topics  . Smoking status: Current Every Day Smoker -- 1.00 packs/day for 20 years    Types: Cigarettes  . Smokeless tobacco: Not on file  . Alcohol Use: No   OB History   Grav Para Term Preterm Abortions TAB SAB Ect  Mult Living                 Review of Systems  HENT: Negative.   Respiratory: Negative for shortness of breath.   Cardiovascular: Negative for chest pain.  Gastrointestinal: Negative for nausea and vomiting.  Genitourinary: Negative for dysuria, urgency, frequency, decreased urine volume and difficulty urinating.  Musculoskeletal: Positive for back pain.  Skin: Negative for wound.  Neurological: Negative for weakness and numbness.  Psychiatric/Behavioral: The patient is not nervous/anxious.     Allergies  Other; Ultram; and Latex  Home Medications   Current Outpatient Rx  Name  Route  Sig  Dispense  Refill  . albuterol (PROAIR HFA) 108 (90 BASE) MCG/ACT inhaler   Inhalation   Inhale 2 puffs into the lungs every 6 (six) hours as needed for wheezing or shortness of breath.          . ALPRAZolam (XANAX) 1 MG tablet   Oral   Take 1 mg by mouth 3 (three) times daily.          Marland Kitchen aspirin EC 81 MG tablet   Oral   Take 81 mg by mouth every morning.          . clopidogrel (PLAVIX) 75 MG tablet   Oral   Take 75 mg by mouth every morning.          . Cyanocobalamin (VITAMIN B-12 PO)   Oral  Take 1 tablet by mouth every morning.         Marland Kitchen HYDROcodone-acetaminophen (NORCO) 10-325 MG per tablet   Oral   Take 1 tablet by mouth every 6 (six) hours as needed.   15 tablet   0   . HYDROcodone-acetaminophen (NORCO/VICODIN) 5-325 MG per tablet   Oral   Take 1 tablet by mouth every 4 (four) hours as needed.   10 tablet   0   . lisinopril (PRINIVIL,ZESTRIL) 5 MG tablet   Oral   Take 5 mg by mouth every morning.         . methocarbamol (ROBAXIN) 500 MG tablet   Oral   Take 1 tablet (500 mg total) by mouth 4 (four) times daily as needed.   60 tablet   0   . metoprolol tartrate (LOPRESSOR) 25 MG tablet   Oral   Take 25 mg by mouth 2 (two) times daily.         . mometasone (NASONEX) 50 MCG/ACT nasal spray   Nasal   Place 2 sprays into the nose daily as needed  (For allergies.).          Marland Kitchen oxyCODONE-acetaminophen (ROXICET) 5-325 MG per tablet   Oral   Take 1-2 tablets by mouth every 4 (four) hours as needed for pain.   60 tablet   0   . potassium chloride SA (K-DUR,KLOR-CON) 20 MEQ tablet   Oral   Take 1 tablet (20 mEq total) by mouth 2 (two) times daily.   10 tablet   0   . pravastatin (PRAVACHOL) 80 MG tablet   Oral   Take 80 mg by mouth at bedtime.          . ranitidine (ZANTAC) 150 MG tablet   Oral   Take 150 mg by mouth as needed for heartburn.         . traMADol-acetaminophen (ULTRACET) 37.5-325 MG per tablet   Oral   Take 2 tablets by mouth every 6 (six) hours as needed for pain.         Marland Kitchen triazolam (HALCION) 0.25 MG tablet   Oral   Take 0.25 mg by mouth at bedtime as needed (For sleep.).          Triage Vitals: BP 101/76  Pulse 78  Temp(Src) 97.6 F (36.4 C) (Oral)  Resp 22  Ht 5\' 1"  (1.549 m)  Wt 130 lb (58.968 kg)  BMI 24.58 kg/m2  SpO2 100% Physical Exam  Nursing note and vitals reviewed. Constitutional: She is oriented to person, place, and time. She appears well-developed and well-nourished. No distress.  HENT:  Head: Normocephalic and atraumatic.  Right Ear: Tympanic membrane normal.  Left Ear: Tympanic membrane normal.  Nose: Nose normal.  Mouth/Throat: Uvula is midline, oropharynx is clear and moist and mucous membranes are normal.  Eyes: EOM are normal.  Neck: Normal range of motion. Neck supple.  Cardiovascular: Normal rate and regular rhythm.   Pulmonary/Chest: Effort normal. She has no wheezes. She has no rales.  Abdominal: Soft. Bowel sounds are normal. There is no tenderness.  Musculoskeletal: Normal range of motion.       Lumbar back: She exhibits tenderness, pain and spasm. She exhibits normal pulse.       Back:  Neurological: She is alert and oriented to person, place, and time. She has normal strength and normal reflexes. She displays normal reflexes. No cranial nerve deficit or  sensory deficit. Gait normal.  Reflex Scores:  Bicep reflexes are 2+ on the right side and 2+ on the left side.      Brachioradialis reflexes are 2+ on the right side and 2+ on the left side.      Patellar reflexes are 2+ on the right side and 2+ on the left side.      Achilles reflexes are 2+ on the right side and 2+ on the left side. Skin: Skin is warm and dry.  Psychiatric: She has a normal mood and affect. Her behavior is normal.    ED Course  Procedures (including critical care time) DIAGNOSTIC STUDIES: Oxygen Saturation is 100% on RA, normal by my interpretation.   COORDINATION OF CARE: 1:40PM- Will give Flexeril and Percocet. Will prescribe Norco and advised pt to continue to take Flexeril at home. Will give a prescription for an NSAID. Pt verbalizes understanding and agrees to plan.  Medications  cyclobenzaprine (FLEXERIL) tablet 5 mg (5 mg Oral Given 06/28/13 1320)  oxyCODONE-acetaminophen (PERCOCET/ROXICET) 5-325 MG per tablet 1 tablet (1 tablet Oral Given 06/28/13 1320)   58 y.o. female with left side back pain that radiates to the left buttock that started after lifting an air condition unit 2 days ago. Will treat for pain and muscle spasm. She has flexeril at home that she will take and I will give her additional pain medication. She will follow up with her PCP or return here as needed. Stable for discharge without neurological deficits.   I have reviewed this patient's vital signs, nurses notes, appropriate labs and imaging.   I personally performed the services described in this documentation, which was scribed in my presence. The recorded information has been reviewed and is accurate.    Janne NapoleonHope M Neese, [place]NP 06/28/13 [phone]

## 2013-06-28 NOTE — Discharge Instructions (Signed)
Continue your muscle relaxant. Take the medications as directed. Do not take the narcotic or muscle relaxant if driving because they will make you sleepy.

## 2013-06-28 NOTE — ED Notes (Signed)
Pt presents to ED c/o back pain x2days. Pt states she picked up an air conditioning on Friday, and the back pain started Saturday. Denies feeling a pop. No numbness or tingling to extremities. Denies ShOB. Pain 10/10 to the top of left hip.

## 2013-07-01 NOTE — ED Provider Notes (Signed)
Medical screening examination/treatment/procedure(s) were performed by non-physician practitioner and as supervising physician I was immediately available for consultation/collaboration.   EKG Interpretation None        Canaan Holzer L Rook Maue, MD 07/01/13 0713 

## 2013-07-23 ENCOUNTER — Ambulatory Visit (INDEPENDENT_AMBULATORY_CARE_PROVIDER_SITE_OTHER): Payer: Medicaid Other

## 2013-07-23 ENCOUNTER — Ambulatory Visit (INDEPENDENT_AMBULATORY_CARE_PROVIDER_SITE_OTHER): Payer: Medicaid Other | Admitting: Orthopedic Surgery

## 2013-07-23 VITALS — BP 129/73 | Ht 61.0 in | Wt 140.0 lb

## 2013-07-23 DIAGNOSIS — M549 Dorsalgia, unspecified: Secondary | ICD-10-CM

## 2013-07-23 MED ORDER — HYDROCODONE-ACETAMINOPHEN 5-325 MG PO TABS: 1.0000 | ORAL_TABLET | Freq: Four times a day (QID) | ORAL | Status: DC | PRN

## 2013-07-23 MED ORDER — NAPROXEN 500 MG PO TABS: 500.0000 mg | ORAL_TABLET | Freq: Two times a day (BID) | ORAL | Status: DC

## 2013-07-23 NOTE — Progress Notes (Addendum)
Patient ID: Emily Patterson, female   DOB: December 05, 1955, 58 y.o.   MRN: [id number]  Chief Complaint  Patient presents with  . Back Pain    ER follow up lower back pain radiates to hips    58 year old female on or bouts 25th of April that she picked up her condition her in about 3 days later she felt pain in her lower back. When she picked at conditioner up she did feel a pop. Onset was sudden. She eventually went to the emergency room for inability to ambulate. She was placed on naproxen hydrocodone and advised to take the Flexeril that she had and she thinks the hydrocodone may have helped her pain she is ambulating a little bit better but still having lower back pain isolated to the lower back. Pain scale is 8 symptoms come and go worse in the morning and after exercise and standing for long periods of time also feels a catching sensation in the lower back but no radicular symptoms no numbness or tingling in the left lower extremity. System review fatigue 2 heart attack a stent placed x3 anxiety easy bleeding and bruising but she is on a blood thinner seasonal allergies joint pain    Past Surgical History  Procedure Laterality Date  . Tubal ligation    . Coronary angioplasty with stent placement  2010  . Open reduction internal fixation (orif) distal radial fracture Right 08/15/2012    Procedure: OPEN REDUCTION INTERNAL FIXATION (ORIF) RIGHT DISTAL RADIAL FRACTURE;  Surgeon: Kathryne Hitchhristopher Y Blackman, MD;  Location: [facility];  Service: Orthopedics;  Laterality: Right;   Past Medical History  Diagnosis Date  . Hypertension   . Myocardial infarction 2010    follow[facility] by Dr. Earna CoderZachary [facility] [place] [facility]  . Leaky heart valve     followed by Dr. Earna CoderZachary [facility] [place] [facility]  . Radius fracture     Right    Vital signs:   General the patient is well-developed and well-nourished grooming and hygiene are normal Oriented x3 Mood and affect normal Ambulation normal  Inspection of the tenderness over the lower  lumbar spine. Straight leg raise is negative. Full range of motion All joints are stable Motor exam is normal Skin clean dry and intact  Cardiovascular exam is normal Sensory exam normal   X-rays 3 views lumbar spine  Overall spinal alignment within normal limits. However there are multilevels of degenerative disc disease with anterior osteophytes and sclerotic bone in the facet joints  Impression mild degenerative disc disease   Isolated lower back pain  Continue naproxen Flexeril hydrocodone. Gradually return to normal activities.

## 2013-07-23 NOTE — Patient Instructions (Addendum)
Evaluation has revealed that you have strained your lower back. There are no surgical indications for this type of injury. This will resolve in time please take medications as prescribed and see your primary care doctor for any further problems.  Back Pain, Adult Low back pain is very common. About 1 in 5 people have back pain.The cause of low back pain is rarely dangerous. The pain often gets better over time.About half of people with a sudden onset of back pain feel better in just 2 weeks. About 8 in 10 people feel better by 6 weeks.  CAUSES Some common causes of back pain include:  Strain of the muscles or ligaments supporting the spine.  Wear and tear (degeneration) of the spinal discs.  Arthritis.  Direct injury to the back. DIAGNOSIS Most of the time, the direct cause of low back pain is not known.However, back pain can be treated effectively even when the exact cause of the pain is unknown.Answering your caregiver's questions about your overall health and symptoms is one of the most accurate ways to make sure the cause of your pain is not dangerous. If your caregiver needs more information, he or she may order lab work or imaging tests (X-rays or MRIs).However, even if imaging tests show changes in your back, this usually does not require surgery. HOME CARE INSTRUCTIONS For many people, back pain returns.Since low back pain is rarely dangerous, it is often a condition that people can learn to [facility] their own.   Remain active. It is stressful on the back to sit or stand in one place. Do not sit, drive, or stand in one place for more than 30 minutes at a time. Take short walks on level surfaces as soon as pain allows.Try to increase the length of time you walk each day.  Do not stay in bed.Resting more than 1 or 2 days can delay your recovery.  Do not avoid exercise or work.Your body is made to move.It is not dangerous to be active, even though your back may hurt.Your back  will likely heal faster if you return to being active before your pain is gone.  Pay attention to your body when you bend and lift. Many people have less discomfortwhen lifting if they bend their knees, keep the load close to their bodies,and avoid twisting. Often, the most comfortable positions are those that put less stress on your recovering back.  Find a comfortable position to sleep. Use a firm mattress and lie on your side with your knees slightly bent. If you lie on your back, put a pillow under your knees.  Only take over-the-counter or prescription medicines as directed by your caregiver. Over-the-counter medicines to reduce pain and inflammation are often the most helpful.Your caregiver may prescribe muscle relaxant drugs.These medicines help dull your pain so you can more quickly return to your normal activities and healthy exercise.  Put ice on the injured area.  Put ice in a plastic bag.  Place a towel between your skin and the bag.  Leave the ice on for 15-20 minutes, 03-04 times a day for the first 2 to 3 days. After that, ice and heat may be alternated to reduce pain and spasms.  Ask your caregiver about trying back exercises and gentle massage. This may be of some benefit.  Avoid feeling anxious or stressed.Stress increases muscle tension and can worsen back pain.It is important to recognize when you are anxious or stressed and learn ways to manage it.Exercise is a great  option. SEEK MEDICAL CARE IF:  You have pain that is not relieved with rest or medicine.  You have pain that does not improve in 1 week.  You have new symptoms.  You are generally not feeling well. SEEK IMMEDIATE MEDICAL CARE IF:   You have pain that radiates from your back into your legs.  You develop new bowel or bladder control problems.  You have unusual weakness or numbness in your arms or legs.  You develop nausea or vomiting.  You develop abdominal pain.  You feel  faint. Document Released: 03/05/2005 Document Revised: 09/04/2011 Document Reviewed: 07/24/2010 [facility] Patient Information 2014 [employer] [place].

## 2013-10-06 ENCOUNTER — Encounter: Payer: Self-pay | Admitting: *Deleted

## 2013-10-08 ENCOUNTER — Ambulatory Visit (INDEPENDENT_AMBULATORY_CARE_PROVIDER_SITE_OTHER): Payer: Medicaid Other | Admitting: Neurology

## 2013-10-08 ENCOUNTER — Encounter: Payer: Self-pay | Admitting: Neurology

## 2013-10-08 ENCOUNTER — Telehealth: Payer: Self-pay | Admitting: Neurology

## 2013-10-08 VITALS — BP 140/83 | HR 60 | Ht 61.0 in | Wt 147.0 lb

## 2013-10-08 DIAGNOSIS — G4486 Cervicogenic headache: Secondary | ICD-10-CM

## 2013-10-08 DIAGNOSIS — R51 Headache: Secondary | ICD-10-CM

## 2013-10-08 HISTORY — DX: Cervicogenic headache: G44.86

## 2013-10-08 MED ORDER — PREGABALIN 50 MG PO CAPS: 50.0000 mg | ORAL_CAPSULE | Freq: Two times a day (BID) | ORAL | Status: DC
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
## 2013-10-08 NOTE — Telephone Encounter (Signed)
In[facility] patient [facility]. She can have them fax report to our office.

## 2013-10-08 NOTE — Patient Instructions (Signed)

## 2013-10-08 NOTE — Telephone Encounter (Signed)
Patient has new patient appointment today with Dr. Anne HahnWillis at 3 pm and patient is wondering if she needs [facility] to this appointment. Please return call to patient and advise.

## 2013-10-14 ENCOUNTER — Telehealth: Payer: Self-pay | Admitting: Neurology

## 2013-10-14 MED ORDER — TIZANIDINE HCL 2 MG PO TABS: ORAL_TABLET | ORAL | Status: DC

## 2013-10-14 NOTE — Telephone Encounter (Signed)
Patient stated she's having side effects of Left eye, nose swelling along with pain after taking pregabalin (LYRICA) 50 MG capsule. Questioning if she needs to come in or does medication need to be changed? Please call anytime today and can leave message if not available.

## 2013-10-14 NOTE — Telephone Encounter (Signed)
Patient calling again, wants to be called back at [phone].

## 2013-10-14 NOTE — Telephone Encounter (Signed)
Patient stated she's having side effects of Left eye, nose swelling along with pain after taking pregabalin (LYRICA) 50 MG capsule. Questioning if she needs to come in or does medication need to be changed? Please call anytime today and can leave message if not available.   

## 2013-10-14 NOTE — Telephone Encounter (Signed)
I called patient. She indicates that the Lyrica call swelling in the eyes and face, may be an allergy, the patient will have to stop the medication. I will try tizanidine.
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
## 2014-02-10 ENCOUNTER — Ambulatory Visit: Payer: Medicaid Other | Admitting: Adult Health

## 2014-02-15 ENCOUNTER — Encounter: Payer: Self-pay | Admitting: *Deleted

## 2015-10-30 ENCOUNTER — Encounter (HOSPITAL_COMMUNITY): Payer: Self-pay | Admitting: Emergency Medicine

## 2015-10-30 ENCOUNTER — Emergency Department (HOSPITAL_COMMUNITY)
Admission: EM | Admit: 2015-10-30 | Discharge: 2015-10-30 | Disposition: A | Discharge status: Home / Self Care | Payer: Medicaid Other | Attending: Emergency Medicine | Admitting: Emergency Medicine

## 2015-10-30 DIAGNOSIS — Z79899 Other long term (current) drug therapy: Secondary | ICD-10-CM | POA: Diagnosis not present

## 2015-10-30 DIAGNOSIS — I10 Essential (primary) hypertension: Secondary | ICD-10-CM | POA: Insufficient documentation

## 2015-10-30 DIAGNOSIS — Z5321 Procedure and treatment not carried out due to patient leaving prior to being seen by health care provider: Secondary | ICD-10-CM | POA: Insufficient documentation

## 2015-10-30 DIAGNOSIS — F17210 Nicotine dependence, cigarettes, uncomplicated: Secondary | ICD-10-CM | POA: Insufficient documentation

## 2015-10-30 DIAGNOSIS — Z7982 Long term (current) use of aspirin: Secondary | ICD-10-CM | POA: Diagnosis not present

## 2015-10-30 DIAGNOSIS — H9201 Otalgia, right ear: Secondary | ICD-10-CM | POA: Insufficient documentation

## 2015-10-30 NOTE — ED Triage Notes (Signed)
Patient c/o right ear ache. Denies any fevers or drainage. Per patient pain is coming from right side of neck. Patient states has appointment to see PCP on Tuesday but pain is progressively getting worse.

## 2015-10-30 NOTE — ED Notes (Signed)
Pt came to nurses desk stating she was leaving and wanting to know how to get out. Pt declined to stay for evaluation.

## 2016-11-29 ENCOUNTER — Ambulatory Visit: Payer: Medicaid Other | Admitting: Family Medicine

## 2016-11-29 NOTE — Progress Notes (Deleted)
    Patient ID: Emily Patterson, female    DOB: June 01, 1955, 61 y.o.   MRN: [id number]  No chief complaint on file.   Allergies Lyrica [pregabalin]; Other; Ultram [tramadol]; and Latex  Subjective:   Emily Patterson is a 61 y.o. female [facility]  HPI HPI  Past Medical History:  Diagnosis Date  . Cervicogenic headache 10/08/2013  . Hyperlipidemia   . Hypertension   . Leaky heart valve    follow[facility]. Earna CoderZachary [facility] [place] [facility]  . Myocardial infarction 2010   followed by Dr. Earna CoderZachary [facility] [place] [facility]  . Radius fracture    Right  . [facility]    right [facility]  infarct by MRI    Past Surgical History:  Procedure Laterality Date  . CAROTID ENDARTERECTOMY Left   . CORONARY ANGIOPLASTY WITH STENT PLACEMENT  2010  . OPEN REDUCTION INTERNAL FIXATION (ORIF) DISTAL RADIAL FRACTURE Right 08/15/2012   Procedure: OPEN REDUCTION INTERNAL FIXATION (ORIF) RIGHT DISTAL RADIAL FRACTURE;  Surgeon: Kathryne Hitchhristopher Y Blackman, MD;  Location: [facility];  Service: Orthopedics;  Laterality: Right;  . TUBAL LIGATION      Family History  Problem Relation Age of Onset  . Diabetes Mother   . Dementia Mother   . Stroke Mother   . Cirrhosis Father   . Diabetes Sister      Social History   Social History  . Marital status: Legally Separated    Spouse name: N/A  . Number of children: 2  . Years of education: 9th   Occupational History  . disability    Social History Main Topics  . Smoking status: Current Every Day Smoker    Packs/day: 1.00    Years: 20.00    Types: Cigarettes  . Smokeless tobacco: Never Used  . Alcohol use No  . Drug use: No  . Sexual activity: Yes    Birth control/ protection: Surgical   Other Topics Concern  . Not on file   Social History Narrative  . No narrative on file    Review of Systems   Objective:   There were no vitals taken for this visit.  Physical Exam   Assessment and Plan   There are no diagnoses linked to  this encounter.   No Follow-up on file. Judd GaudierShannon M Levens, CMA 11/29/2016

## 2017-01-15 ENCOUNTER — Ambulatory Visit (HOSPITAL_COMMUNITY)
Admission: RE | Admit: 2017-01-15 | Discharge: 2017-01-15 | Disposition: A | Discharge status: Home / Self Care | Payer: Medicaid Other | Source: Ambulatory Visit | Attending: Family Medicine | Admitting: Family Medicine

## 2017-01-15 ENCOUNTER — Other Ambulatory Visit (HOSPITAL_COMMUNITY): Payer: Self-pay | Admitting: Family Medicine

## 2017-01-15 ENCOUNTER — Other Ambulatory Visit (HOSPITAL_COMMUNITY)
Admission: RE | Admit: 2017-01-15 | Discharge: 2017-01-15 | Disposition: A | Discharge status: Home / Self Care | Payer: Medicaid Other | Source: Ambulatory Visit | Attending: Internal Medicine | Admitting: Internal Medicine

## 2017-01-15 DIAGNOSIS — R5383 Other fatigue: Secondary | ICD-10-CM | POA: Insufficient documentation

## 2017-01-15 DIAGNOSIS — M542 Cervicalgia: Secondary | ICD-10-CM | POA: Diagnosis present

## 2017-01-15 DIAGNOSIS — M50322 Other cervical disc degeneration at C5-C6 level: Secondary | ICD-10-CM | POA: Diagnosis not present

## 2017-01-15 DIAGNOSIS — D559 Anemia due to enzyme disorder, unspecified: Secondary | ICD-10-CM | POA: Insufficient documentation

## 2017-01-15 DIAGNOSIS — I110 Hypertensive heart disease with heart failure: Secondary | ICD-10-CM | POA: Insufficient documentation

## 2017-01-15 DIAGNOSIS — D518 Other vitamin B12 deficiency anemias: Secondary | ICD-10-CM | POA: Insufficient documentation

## 2017-01-15 LAB — LIPID PANEL
Cholesterol: 123 mg/dL (ref 0–200)
HDL: 35 mg/dL — ABNORMAL LOW (ref >40)
LDL Cholesterol: 72 mg/dL (ref 0–99)
Total CHOL/HDL Ratio: 3.5 RATIO
Triglycerides: 79 mg/dL (ref <150)
VLDL: 16 mg/dL (ref 0–40)

## 2017-01-15 LAB — T4, FREE: Free T4: 0.68 ng/dL (ref 0.61–1.12)

## 2017-01-15 LAB — COMPREHENSIVE METABOLIC PANEL
ALT: 23 U/L (ref 14–54)
AST: 20 U/L (ref 15–41)
Albumin: 4.2 g/dL (ref 3.5–5.0)
Alkaline Phosphatase: 80 U/L (ref 38–126)
Anion gap: 6 (ref 5–15)
BUN: 12 mg/dL (ref 6–20)
CO2: 27 mmol/L (ref 22–32)
Calcium: 9.5 mg/dL (ref 8.9–10.3)
Chloride: 102 mmol/L (ref 101–111)
Creatinine, Ser: 0.83 mg/dL (ref 0.44–1.00)
GFR calc Af Amer: >60 mL/min (ref >60)
GFR calc non Af Amer: >60 mL/min (ref >60)
Glucose, Bld: 103 mg/dL — ABNORMAL HIGH (ref 65–99)
Potassium: 3.9 mmol/L (ref 3.5–5.1)
Sodium: 135 mmol/L (ref 135–145)
Total Bilirubin: 0.2 mg/dL — ABNORMAL LOW (ref 0.3–1.2)
Total Protein: 7.4 g/dL (ref 6.5–8.1)

## 2017-01-15 LAB — CBC
HCT: 39.9 % (ref 36.0–46.0)
Hemoglobin: 13.2 g/dL (ref 12.0–15.0)
MCH: 31.4 pg (ref 26.0–34.0)
MCHC: 33.1 g/dL (ref 30.0–36.0)
MCV: 94.8 fL (ref 78.0–100.0)
Platelets: 187 10*3/uL (ref 150–400)
RBC: 4.21 MIL/uL (ref 3.87–5.11)
RDW: 13.6 % (ref 11.5–15.5)
WBC: 5.6 10*3/uL (ref 4.0–10.5)

## 2017-01-15 LAB — VITAMIN B12: Vitamin B-12: 170 pg/mL — ABNORMAL LOW (ref 180–914)

## 2017-01-15 LAB — TSH: TSH: 1.973 u[IU]/mL (ref 0.350–4.500)

## 2017-01-16 LAB — VITAMIN D 25 HYDROXY (VIT D DEFICIENCY, FRACTURES): Vit D, 25-Hydroxy: 27.1 ng/mL — ABNORMAL LOW (ref 30.0–100.0)

## 2018-08-30 ENCOUNTER — Other Ambulatory Visit: Payer: Self-pay

## 2018-08-30 ENCOUNTER — Encounter (HOSPITAL_COMMUNITY): Payer: Self-pay | Admitting: *Deleted

## 2018-08-30 ENCOUNTER — Emergency Department (HOSPITAL_COMMUNITY)
Admission: EM | Admit: 2018-08-30 | Discharge: 2018-08-31 | Disposition: A | Discharge status: Home / Self Care | Payer: Medicaid Other | Attending: Emergency Medicine | Admitting: Emergency Medicine

## 2018-08-30 DIAGNOSIS — F17210 Nicotine dependence, cigarettes, uncomplicated: Secondary | ICD-10-CM | POA: Insufficient documentation

## 2018-08-30 DIAGNOSIS — Z91040 Latex allergy status: Secondary | ICD-10-CM | POA: Diagnosis not present

## 2018-08-30 DIAGNOSIS — Z7902 Long term (current) use of antithrombotics/antiplatelets: Secondary | ICD-10-CM | POA: Diagnosis not present

## 2018-08-30 DIAGNOSIS — I10 Essential (primary) hypertension: Secondary | ICD-10-CM | POA: Insufficient documentation

## 2018-08-30 DIAGNOSIS — K0889 Other specified disorders of teeth and supporting structures: Secondary | ICD-10-CM | POA: Diagnosis not present

## 2018-08-30 DIAGNOSIS — Z79899 Other long term (current) drug therapy: Secondary | ICD-10-CM | POA: Insufficient documentation

## 2018-08-30 DIAGNOSIS — Z7982 Long term (current) use of aspirin: Secondary | ICD-10-CM | POA: Diagnosis not present

## 2018-08-30 DIAGNOSIS — K029 Dental caries, unspecified: Secondary | ICD-10-CM | POA: Insufficient documentation

## 2018-08-30 NOTE — ED Triage Notes (Signed)
Pt states she bit into a bone and broke her top teeth off x 5 days ago; pt is c/o right ear pain

## 2018-08-31 MED ORDER — OXYCODONE-ACETAMINOPHEN 5-325 MG PO TABS
1.0000 | ORAL_TABLET | Freq: Once | ORAL | Status: AC
  Administered 2018-08-31: 1 via ORAL
  Filled 2018-08-31: qty 1

## 2018-08-31 MED ORDER — PENICILLIN V POTASSIUM 250 MG PO TABS
500.0000 mg | ORAL_TABLET | Freq: Once | ORAL | Status: AC
  Administered 2018-08-31: 500 mg via ORAL
  Filled 2018-08-31: qty 2

## 2018-08-31 MED ORDER — PENICILLIN V POTASSIUM 500 MG PO TABS: 500.0000 mg | ORAL_TABLET | Freq: Three times a day (TID) | ORAL | 0 refills | Status: DC

## 2018-08-31 MED ORDER — OXYCODONE-ACETAMINOPHEN 5-325 MG PO TABS: 1.0000 | ORAL_TABLET | Freq: Four times a day (QID) | ORAL | 0 refills | Status: DC | PRN

## 2018-08-31 NOTE — ED Provider Notes (Signed)
[facility] EMERGENCY DEPARTMENT Provider Note   CSN: [id number] Arrival date & time: 08/30/18  2121     History   Chief Complaint Chief Complaint  Patient presents with  . Dental Pain    HPI Emily Patterson is a 63 y.o. female.     HPI   Emily Patterson is a 63 y.o. female who presents to the Emergency Department complaining of upper dental pain for 5 days.  Has hx of dental implants of her upper front teeth.  States she broke the implanted teeth when she accidentally bit down on a chicken bone.  She describes constant, stabbing pain to all of her frontal teeth and intermittent stabbing pain to both ears with right greater than left.  She saw her dentist earlier this week and has been referred to a maxillofacial surgeon and has an appt Wednesday.  She is requesting pain control until she sees her dentist.  She denies fever, chills, facial swelling, difficulty swallowing or breathing and neck pain.    Past Medical History:  Diagnosis Date  . Cervicogenic headache 10/08/2013  . Hyperlipidemia   . Hypertension   . Leaky heart valve    followed by Dr. Earna CoderZachary [facility]  . Myocardial infarction [facility](HCC) 2010   followed by Dr. Earna CoderZachary [facility]  . Radius fracture    Right  . 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 Cervicogenic headache 10/08/2013  . Fracture of right distal radius 08/15/2012    Past Surgical History:  Procedure Laterality Date  . CAROTID ENDARTERECTOMY Left   . CORONARY ANGIOPLASTY WITH STENT PLACEMENT  2010  . OPEN REDUCTION INTERNAL FIXATION (ORIF) DISTAL RADIAL FRACTURE Right 08/15/2012   Procedure: OPEN REDUCTION INTERNAL FIXATION (ORIF) RIGHT DISTAL RADIAL FRACTURE;  Surgeon: Kathryne Hitchhristopher Y Blackman, MD;  Location: [facility] ORS;  Service: Orthopedics;  Laterality: Right;  . TUBAL LIGATION       OB History   No obstetric history on file.      Home Medications    Prior to Admission  medications   Medication Sig Start Date End Date Taking? Authorizing Provider  albuterol (PROAIR HFA) 108 (90 BASE) MCG/ACT inhaler Inhale 2 puffs into the lungs every 6 (six) hours as needed for wheezing or shortness of breath.     [provider]  ALPRAZolam Prudy Feeler(XANAX) 1 MG tablet Take 1 mg by mouth 3 (three) times daily.     [provider]  aspirin EC 81 MG tablet Take 81 mg by mouth every morning.     [provider]  atenolol (TENORMIN) 25 MG tablet Take 25 mg by mouth daily.    [provider]  clopidogrel (PLAVIX) 75 MG tablet Take 75 mg by mouth at bedtime.     [provider]  hydrochlorothiazide (HYDRODIURIL) 25 MG tablet Take 25 mg by mouth daily.    [provider]  HYDROcodone-acetaminophen (NORCO) 10-325 MG per tablet Take 1 tablet by mouth every 6 (six) hours as needed for moderate pain.     [provider]  lisinopril (PRINIVIL,ZESTRIL) 5 MG tablet Take 5 mg by mouth 2 (two) times daily.     [provider]  metoprolol tartrate (LOPRESSOR) 25 MG tablet Take 25 mg by mouth 2 (two) times daily.    [provider]  pravastatin (PRAVACHOL) 80 MG tablet Take 80 mg by mouth at bedtime.  [provider]  ranitidine (ZANTAC) 150 MG tablet Take 150 mg by mouth as needed for heartburn.    [provider]  tiZANidine (ZANAFLEX) 2 MG tablet 1 tablet 3 times daily for 2 weeks, then take 2 tablets 3 times daily. 10/14/13   Kathrynn Ducking, MD    Family History Family History  Problem Relation Age of Onset  . Diabetes Mother   . Dementia Mother   . Stroke Mother   . Cirrhosis Father   . Diabetes Sister     Social History Social History   Tobacco Use  . Smoking status: Current Some Day Smoker    Packs/day: 1.00    Years: 20.00    Pack years: 20.00    Types: Cigarettes  . Smokeless tobacco: Never Used  Substance Use Topics  . Alcohol use: No  . Drug use: No     Allergies    Lyrica [pregabalin], Other, Ultram [tramadol], and Latex   Review of Systems Review of Systems  Constitutional: Negative for appetite change and fever.  HENT: Positive for dental problem and ear pain. Negative for congestion, facial swelling, sore throat and trouble swallowing.   Eyes: Negative for pain and visual disturbance.  Respiratory: Negative for shortness of breath.   Cardiovascular: Negative for chest pain.  Musculoskeletal: Negative for neck pain and neck stiffness.  Neurological: Negative for dizziness, facial asymmetry and headaches.  Hematological: Negative for adenopathy.     Physical Exam Updated Vital Signs BP (!) 155/82 (BP Location: Right Arm)   Pulse 99   Temp 98.7 F (37.1 C) (Oral)   Resp 17   Ht 5\' 1"  (1.549 m)   Wt 58.1 kg   SpO2 100%   BMI 24.19 kg/m   Physical Exam Vitals signs and nursing note reviewed.  Constitutional:      Appearance: Normal appearance. She is not ill-appearing or toxic-appearing.  HENT:     Head: Atraumatic.     Right Ear: Tympanic membrane and ear canal normal.     Left Ear: Tympanic membrane and ear canal normal.     Mouth/Throat:     Mouth: Mucous membranes are moist.     Dentition: Dental tenderness and dental caries present. No dental abscesses.     Pharynx: Oropharynx is clear. Uvula midline. No uvula swelling.     Comments: Multiple dental caries, partially edentulous of the upper front teeth with exposed hardware from dental implant  Neurological:     Mental Status: She is alert.      ED Treatments / Results  Labs (all labs ordered are listed, but only abnormal results are displayed) Labs Reviewed - No data to display  EKG    Radiology No results found.  Procedures Procedures (including critical care time)  Medications Ordered in ED Medications - No data to display   Initial Impression / Assessment and Plan / ED Course  I have reviewed the triage vital signs and the nursing notes.  Pertinent  labs & imaging results that were available during my care of the patient were reviewed by me and considered in my medical decision making (see chart for details).        Pt is well appearing.  She has hardware exposed from previous dental implant.  Airway is patent.  No facial edema or evidence of dental abscess.  No concerning sx's for Ludwig's angina.  She has appt with surgeon for next week.  Written for short course of pain medication and abx.  Database reviewed.  Final Clinical Impressions(s) / ED Diagnoses   Final diagnoses:  Pain, dental    ED Discharge Orders    None       Pauline Ausriplett, Crew Goren, PA-C 08/31/18 1656    Long, Arlyss RepressJoshua G, MD 08/31/18 2019

## 2018-08-31 NOTE — Discharge Instructions (Addendum)
Be sure to follow-up with your surgeon on Wednesday.

## 2018-09-11 ENCOUNTER — Other Ambulatory Visit: Payer: Self-pay

## 2018-09-11 ENCOUNTER — Emergency Department (HOSPITAL_COMMUNITY)
Admission: EM | Admit: 2018-09-11 | Discharge: 2018-09-11 | Disposition: A | Discharge status: Home / Self Care | Payer: Medicaid Other | Attending: Emergency Medicine | Admitting: Emergency Medicine

## 2018-09-11 ENCOUNTER — Encounter (HOSPITAL_COMMUNITY): Payer: Self-pay | Admitting: Emergency Medicine

## 2018-09-11 DIAGNOSIS — G8918 Other acute postprocedural pain: Secondary | ICD-10-CM | POA: Insufficient documentation

## 2018-09-11 DIAGNOSIS — I10 Essential (primary) hypertension: Secondary | ICD-10-CM | POA: Insufficient documentation

## 2018-09-11 DIAGNOSIS — Z98818 Other dental procedure status: Secondary | ICD-10-CM | POA: Insufficient documentation

## 2018-09-11 DIAGNOSIS — I2510 Atherosclerotic heart disease of native coronary artery without angina pectoris: Secondary | ICD-10-CM | POA: Diagnosis not present

## 2018-09-11 DIAGNOSIS — Z7982 Long term (current) use of aspirin: Secondary | ICD-10-CM | POA: Insufficient documentation

## 2018-09-11 DIAGNOSIS — F17210 Nicotine dependence, cigarettes, uncomplicated: Secondary | ICD-10-CM | POA: Diagnosis not present

## 2018-09-11 DIAGNOSIS — Z79899 Other long term (current) drug therapy: Secondary | ICD-10-CM | POA: Insufficient documentation

## 2018-09-11 MED ORDER — OXYCODONE-ACETAMINOPHEN 5-325 MG PO TABS
1.0000 | ORAL_TABLET | Freq: Once | ORAL | Status: AC
  Administered 2018-09-11: 1 via ORAL
  Filled 2018-09-11: qty 1

## 2018-09-11 NOTE — ED Notes (Signed)
Pt complaining of facial swelling and pain in her upper maxilla post op from dental surgery.

## 2018-09-11 NOTE — Discharge Instructions (Signed)
You were seen in the emergency department today for postoperative pain after a dental procedure.  You were given 1 dose of Percocet in the emergency department.  Please take Tylenol per over-the-counter dosing to further assist with discomfort.  Please call your oral surgeon or 1 of the dentists listed in your discharge instructions for close follow-up within the next 1 to 2 days.  Return to the ER for new or worsening symptoms including but not limited to fever, chills, inability to open your mouth, inability to swallow, swelling beneath the tongue, neck stiffness, or any other concerns.  Please additionally follow-up with primary care for a recheck of your blood pressure as it was elevated in the emergency department today. Vitals:   09/11/18 1154  BP: (!) 176/99  Pulse: 93  Resp: 18  Temp: 97.6 F (36.4 C)  SpO2: 100%

## 2018-09-11 NOTE — ED Triage Notes (Signed)
Patient reporting pain from post surgical procedure on her gums. States stitches starting coming out last night. Surgery was Monday.

## 2018-09-11 NOTE — ED Provider Notes (Signed)
[facility] EMERGENCY DEPARTMENT Provider Note   CSN: [id number] Arrival date & time: 09/11/18  1149     History   Chief Complaint Chief Complaint  Patient presents with  . Post-op Problem    HPI Emily Patterson is a 63 y.o. female with a hx of tobacco abuse, CAD, HTN, hyperlipidemia, prior stroke who presents to the ED w/ complaints of post op pain s/p dental implant & post extraction performed 4 days (6/22) prior by maxillofacial surgeon Dr. Luvenia Heller. Patient states she had prior dental implants to the upper front teeth & mid may she bit down on a frozen meal with a large bone it resulting in a large portion of the implants to fall out. She had seen her dentist & an ED provider with ultimate referral to maxillofacial surgery. She states that Dr. Luvenia Heller extracted the remaining implants (believes 5-6 effected teeth) & 1 post 4 days prior. She states he provided her with 8-10 tablets of hydrocodone to take 1 every 6 hours which she has been taking without relief of pain. States she is having a lot of discomfort to the upper gums that is currently an 8/10 in severity. No alleviating/aggravating factors. Also tried ibuprofen w/o relief. Notes associated swelling, no other concerns, mentioned sutures concern to triage but denies this to me. She states her husband tried calling Dr. Sueanne Margarita office today regarding pain control issues and they would not prescribe her further narcotics. Denies fever, chills, sore throat, trismus, swelling beneath tongue, voice change, drooling, intra-oral drainage, bleeding, or neck stiffness.  She is currently taking penicillin VK.        Past Medical History:  Diagnosis Date  . Cervicogenic headache 10/08/2013  . Hyperlipidemia   . Hypertension   . Leaky heart valve    followed by Dr. Alroy [facility]  . Myocardial infarction [facility]) 2010   followed by Dr. Alroy [facility]  . Radius fracture    Right  . 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 Cervicogenic headache 10/08/2013  . Fracture of right distal radius 08/15/2012    Past Surgical History:  Procedure Laterality Date  . CAROTID ENDARTERECTOMY Left   . CORONARY ANGIOPLASTY WITH STENT PLACEMENT  2010  . OPEN REDUCTION INTERNAL FIXATION (ORIF) DISTAL RADIAL FRACTURE Right 08/15/2012   Procedure: OPEN REDUCTION INTERNAL FIXATION (ORIF) RIGHT DISTAL RADIAL FRACTURE;  Surgeon: Mcarthur Rossetti, MD;  Location: [facility];  Service: Orthopedics;  Laterality: Right;  . TUBAL LIGATION       OB History    Gravida  2   Para  2   Term  2   Preterm      AB      Living        SAB      TAB      Ectopic      Multiple      Live Births               Home Medications    Prior to Admission medications   Medication Sig Start Date End Date Taking? Authorizing Provider  albuterol (PROAIR HFA) 108 (90 BASE) MCG/ACT inhaler Inhale 2 puffs into the lungs every 6 (six) hours as needed for wheezing or shortness of breath.     [provider]  ALPRAZolam Duanne Moron) 1 MG tablet Take 1 mg by mouth 3 (three) times daily.  [provider]  aspirin EC 81 MG tablet Take 81 mg by mouth every morning.     [provider]  atenolol (TENORMIN) 25 MG tablet Take 25 mg by mouth daily.    [provider]  clopidogrel (PLAVIX) 75 MG tablet Take 75 mg by mouth at bedtime.     [provider]  hydrochlorothiazide (HYDRODIURIL) 25 MG tablet Take 25 mg by mouth daily.    [provider]  HYDROcodone-acetaminophen (NORCO) 10-325 MG per tablet Take 1 tablet by mouth every 6 (six) hours as needed for moderate pain.     [provider]  lisinopril (PRINIVIL,ZESTRIL) 5 MG tablet Take 5 mg by mouth 2 (two) times daily.     [provider]  metoprolol tartrate (LOPRESSOR) 25 MG tablet Take 25 mg by mouth 2 (two) times daily.    [provider]   oxyCODONE-acetaminophen (PERCOCET/ROXICET) 5-325 MG tablet Take 1 tablet by mouth every 6 (six) hours as needed for severe pain. 08/31/18   Triplett, Tammy, PA-C  penicillin v potassium (VEETID) 500 MG tablet Take 1 tablet (500 mg total) by mouth 3 (three) times daily. 08/31/18   Triplett, Tammy, PA-C  pravastatin (PRAVACHOL) 80 MG tablet Take 80 mg by mouth at bedtime.     [provider]  ranitidine (ZANTAC) 150 MG tablet Take 150 mg by mouth as needed for heartburn.    [provider]  tiZANidine (ZANAFLEX) 2 MG tablet 1 tablet 3 times daily for 2 weeks, then take 2 tablets 3 times daily. 10/14/13   York SpanielWillis, Charles K, MD    Family History Family History  Problem Relation Age of Onset  . Diabetes Mother   . Dementia Mother   . Stroke Mother   . Cirrhosis Father   . Diabetes Sister     Social History Social History   Tobacco Use  . Smoking status: Current Some Day Smoker    Packs/day: 1.00    Years: 20.00    Pack years: 20.00    Types: Cigarettes  . Smokeless tobacco: Never Used  Substance Use Topics  . Alcohol use: No  . Drug use: No     Allergies   Lyrica [pregabalin], Other, Ultram [tramadol], and Latex   Review of Systems Review of Systems  Constitutional: Negative for chills and fever.  HENT: Positive for dental problem. Negative for facial swelling, sore throat, trouble swallowing and voice change.   Respiratory: Negative for shortness of breath.   Cardiovascular: Negative for chest pain.  Gastrointestinal: Negative for vomiting.     Physical Exam Updated Vital Signs BP (!) 176/99 (BP Location: Right Arm)   Pulse 93   Temp 97.6 F (36.4 C) (Tympanic)   Resp 18   Ht 5\' 1"  (1.549 m)   Wt 65.8 kg   SpO2 100%   BMI 27.40 kg/m  Physical Exam  Constitutional: She is well-developed, well-nourished, and in no distress. No distress.  HENT:  Head: Normocephalic and atraumatic.  Right Ear: Tympanic membrane is not perforated, not  erythematous, not retracted and not bulging.  Left Ear: Tympanic membrane is not perforated, not erythematous, not retracted and not bulging.  Nose: Nose normal.  Mouth/Throat: Uvula is midline and oropharynx is clear and moist.    Dentition absent to upper and lower gum lines. Posterior oropharynx is symmetric appearing. Patient tolerating own secretions without difficulty. No trismus. No drooling. No hot potato voice. No swelling beneath the tongue, submandibular compartment is soft.   Eyes:  Conjunctivae are normal.  Neck: Normal range of motion. Neck supple. No neck rigidity. No edema and no erythema present.  No nuchal rigidity.  Cardiovascular: Normal rate.  Pulmonary/Chest: Effort normal.  Neurological: She is alert.  Skin: Skin is warm and dry.  Psychiatric: Mood and affect normal.   ED Treatments / Results  Labs (all labs ordered are listed, but only abnormal results are displayed) Labs Reviewed - No data to display  EKG    Radiology No results found.  Procedures Procedures (including critical care time)  Medications Ordered in ED Medications  oxyCODONE-acetaminophen (PERCOCET/ROXICET) 5-325 MG per tablet 1 tablet (1 tablet Oral Given 09/11/18 1236)     Initial Impression / Assessment and Plan / ED Course  I have reviewed the triage vital signs and the nursing notes.  Pertinent labs & imaging results that were available during my care of the patient were reviewed by me and considered in my medical decision making (see chart for details).   Patient presents to the emergency department with complaints of postop pain status post dental implant/post excision performed 09/08/18 by maxillofacial surgeon Dr. Ellery PlunkBest.  Patient is nontoxic-appearing, no apparent distress, vitals with elevated blood pressure, doubt HTN emergency, PCP recheck.  On exam patient appears to have upper gumline changes consistent with recent extractions.  No active bleeding.  Surgical incision appears  to be healing appropriately without signs of infection, no purulent drainage, erythema, or gross abscess.  No signs of deep space infection, do not suspect Ludwig's angina-patient tolerating her own secretions without difficulty, no trismus, no drooling, no swelling beneath the tongue, submandibular compartment is soft, neck with full active range of motion. Will provide dose of analgesics in the ED. [place] Cont[place] Substance [place] System queried, has had 2 short courses of narcotics related to dental issues within past 2 weeks, do not feel that repeat prescription for narcotics is appropriate in ER setting at this time. Will need to follow up close with her surgeon. Strict return precautions discussed. I discussed treatment plan, need for follow-up, and return precautions with the patient. Provided opportunity for questions, patient confirmed understanding and is in agreement with plan.   This is a shared visit with supervising physician Dr. Jodi MourningZavitz who has independently evaluated patient & is in agreement.   Final Clinical Impressions(s) / ED Diagnoses   Final diagnoses:  Post-operative pain    ED Discharge Orders    None       Cherly Andersonetrucelli, Shaquille Janes R, PA-C 09/11/18 1246    Blane OharaZavitz, Joshua, MD 09/13/18 [phone]

## 2018-11-10 ENCOUNTER — Other Ambulatory Visit: Payer: Self-pay

## 2018-11-10 ENCOUNTER — Encounter (HOSPITAL_COMMUNITY): Payer: Self-pay | Admitting: Emergency Medicine

## 2018-11-10 ENCOUNTER — Emergency Department (HOSPITAL_COMMUNITY)
Admission: EM | Admit: 2018-11-10 | Discharge: 2018-11-10 | Disposition: A | Discharge status: Home / Self Care | Payer: Medicaid Other | Attending: Emergency Medicine | Admitting: Emergency Medicine

## 2018-11-10 DIAGNOSIS — M542 Cervicalgia: Secondary | ICD-10-CM | POA: Insufficient documentation

## 2018-11-10 DIAGNOSIS — I10 Essential (primary) hypertension: Secondary | ICD-10-CM | POA: Diagnosis not present

## 2018-11-10 DIAGNOSIS — H9203 Otalgia, bilateral: Secondary | ICD-10-CM | POA: Diagnosis not present

## 2018-11-10 DIAGNOSIS — F17210 Nicotine dependence, cigarettes, uncomplicated: Secondary | ICD-10-CM | POA: Diagnosis not present

## 2018-11-10 DIAGNOSIS — Z7982 Long term (current) use of aspirin: Secondary | ICD-10-CM | POA: Diagnosis not present

## 2018-11-10 DIAGNOSIS — Z79899 Other long term (current) drug therapy: Secondary | ICD-10-CM | POA: Insufficient documentation

## 2018-11-10 MED ORDER — OXYCODONE-ACETAMINOPHEN 5-325 MG PO TABS
1.0000 | ORAL_TABLET | Freq: Once | ORAL | Status: AC
  Administered 2018-11-10: 12:00:00 1 via ORAL
  Filled 2018-11-10: qty 1

## 2018-11-10 MED ORDER — HYDROCODONE-ACETAMINOPHEN 5-325 MG PO TABS: ORAL_TABLET | ORAL | 0 refills | Status: DC

## 2018-11-10 MED ORDER — DEXAMETHASONE SODIUM PHOSPHATE 10 MG/ML IJ SOLN
10.0000 mg | Freq: Once | INTRAMUSCULAR | Status: DC
Start: 2018-11-10 — End: 2018-11-10
  Filled 2018-11-10: qty 1

## 2018-11-10 NOTE — ED Notes (Signed)
Pt left without her d/c instructions after getting her percocet. Refused her decadron stating she had to leave.

## 2018-11-10 NOTE — ED Triage Notes (Signed)
Pt has had bilateral ear pain x 2 days.

## 2018-11-10 NOTE — Discharge Instructions (Addendum)
Take your prednisone prescription as directed.  Follow-up with your primary doctor or you may contact Dr. Ruthe Mannan office to arrange a follow-up appointment regarding your neck pain

## 2018-11-10 NOTE — ED Provider Notes (Signed)
[facility] EMERGENCY DEPARTMENT Provider Note   CSN: [id number] Arrival date & time: 11/10/18  1040     History   Chief Complaint Chief Complaint  Patient presents with  . Otalgia    HPI Emily Patterson is a 63 y.o. female.     HPI   Emily Patterson is a 63 y.o. female who presents to the Emergency Department complaining of recurrent bilateral ear pain and neck pain.  She states that she has a "broken bone" in her neck that occurred "many years ago" that caused a pinched nerve in her neck.  She reports having recurrent ear pain secondary to the pinged nerve.  She states that her previous orthopedic provider would prescribe her pain medication for her symptoms, but she has retired.  She is requesting pain control and referral to another orthopedic provider.   She denies new or changing symptoms.  She describes a sharp stabbing pain in her ears that is intermittent lasting a few seconds.  She states that she was referred to a pain specialist, but she hasn't tried to arrange an appointment. She denies numbness or weakness or her face or extremities, headache, visual changes and fever.  No recent injury    Past Medical History:  Diagnosis Date  . Cervicogenic headache 10/08/2013  . Hyperlipidemia   . Hypertension   . Leaky heart valve    [facility]  . Myocardial infarction [facility](HCC) 2010   followed by Dr. Earna CoderZachary [facility]  . Radius fracture    Right  . 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 Cervicogenic headache 10/08/2013  . Fracture of right distal radius 08/15/2012    Past Surgical History:  Procedure Laterality Date  . CAROTID ENDARTERECTOMY Left   . CORONARY ANGIOPLASTY WITH STENT PLACEMENT  2010  . OPEN REDUCTION INTERNAL FIXATION (ORIF) DISTAL RADIAL FRACTURE Right 08/15/2012   Procedure: OPEN REDUCTION INTERNAL FIXATION (ORIF) RIGHT DISTAL RADIAL FRACTURE;  Surgeon:  Kathryne Hitchhristopher Y Blackman, MD;  Location: [facility] ORS;  Service: Orthopedics;  Laterality: Right;  . TUBAL LIGATION       OB History    Gravida  2   Para  2   Term  2   Preterm      AB      Living        SAB      TAB      Ectopic      Multiple      Live Births               Home Medications    Prior to Admission medications   Medication Sig Start Date End Date Taking? Authorizing Provider  albuterol (PROAIR HFA) 108 (90 BASE) MCG/ACT inhaler Inhale 2 puffs into the lungs every 6 (six) hours as needed for wheezing or shortness of breath.     [provider]  ALPRAZolam Prudy Feeler(XANAX) 1 MG tablet Take 1 mg by mouth 3 (three) times daily.     [provider]  aspirin EC 81 MG tablet Take 81 mg by mouth every morning.     [provider]  atenolol (TENORMIN) 25 MG tablet Take 25 mg by mouth daily.    [provider]  clopidogrel (PLAVIX) 75 MG tablet Take 75 mg by mouth at bedtime.     [provider]  hydrochlorothiazide (HYDRODIURIL)  25 MG tablet Take 25 mg by mouth daily.    [provider]  HYDROcodone-acetaminophen (NORCO) 10-325 MG per tablet Take 1 tablet by mouth every 6 (six) hours as needed for moderate pain.     [provider]  lisinopril (PRINIVIL,ZESTRIL) 5 MG tablet Take 5 mg by mouth 2 (two) times daily.     [provider]  metoprolol tartrate (LOPRESSOR) 25 MG tablet Take 25 mg by mouth 2 (two) times daily.    [provider]  oxyCODONE-acetaminophen (PERCOCET/ROXICET) 5-325 MG tablet Take 1 tablet by mouth every 6 (six) hours as needed for severe pain. 08/31/18   Pietra Zuluaga, PA-C  penicillin v potassium (VEETID) 500 MG tablet Take 1 tablet (500 mg total) by mouth 3 (three) times daily. 08/31/18   Sharece Fleischhacker, PA-C  pravastatin (PRAVACHOL) 80 MG tablet Take 80 mg by mouth at bedtime.     [provider]  ranitidine (ZANTAC) 150 MG tablet Take 150 mg by mouth as needed for  heartburn.    [provider]  tiZANidine (ZANAFLEX) 2 MG tablet 1 tablet 3 times daily for 2 weeks, then take 2 tablets 3 times daily. 10/14/13   York SpanielWillis, Charles K, MD    Family History Family History  Problem Relation Age of Onset  . Diabetes Mother   . Dementia Mother   . Stroke Mother   . Cirrhosis Father   . Diabetes Sister     Social History Social History   Tobacco Use  . Smoking status: Current Some Day Smoker    Packs/day: 1.00    Years: 20.00    Pack years: 20.00    Types: Cigarettes  . Smokeless tobacco: Never Used  Substance Use Topics  . Alcohol use: No  . Drug use: No     Allergies   Lyrica [pregabalin], Other, Ultram [tramadol], and Latex   Review of Systems Review of Systems  Constitutional: Negative for chills and fever.  HENT: Positive for ear pain. Negative for sore throat and trouble swallowing.   Eyes: Negative for pain and visual disturbance.  Respiratory: Negative for shortness of breath.   Cardiovascular: Negative for chest pain.  Gastrointestinal: Negative for abdominal pain, nausea and vomiting.  Musculoskeletal: Positive for neck pain. Negative for arthralgias, back pain and joint swelling.  Skin: Negative for color change and wound.  Neurological: Negative for dizziness, syncope, weakness, numbness and headaches.     Physical Exam Updated Vital Signs BP (!) 166/100 (BP Location: Right Arm)   Pulse 93   Temp (!) 97.4 F (36.3 C) (Oral)   Resp 16   Ht 5\' 1"  (1.549 m)   Wt 63.5 kg   SpO2 99%   BMI 26.45 kg/m   Physical Exam Vitals signs and nursing note reviewed.  Constitutional:      General: She is not in acute distress.    Appearance: Normal appearance. She is not ill-appearing or toxic-appearing.  HENT:     Head: Normocephalic and atraumatic.     Right Ear: Tympanic membrane and ear canal normal.     Left Ear: Tympanic membrane and ear canal normal.     Nose: No congestion.     Mouth/Throat:     Mouth: Mucous  membranes are moist.     Pharynx: Oropharynx is clear. No oropharyngeal exudate or posterior oropharyngeal erythema.  Neck:     Musculoskeletal: Normal range of motion and neck supple. No neck rigidity.  Cardiovascular:     Rate and Rhythm:  Normal rate and regular rhythm.     Pulses: Normal pulses.  Pulmonary:     Effort: Pulmonary effort is normal.     Breath sounds: Normal breath sounds. No wheezing.  Chest:     Chest wall: No tenderness.  Musculoskeletal: Normal range of motion.        General: No swelling or signs of injury.     Right lower leg: No edema.     Left lower leg: No edema.  Skin:    General: Skin is warm.     Capillary Refill: Capillary refill takes less than 2 seconds.     Findings: No erythema or rash.  Neurological:     General: No focal deficit present.     Mental Status: She is alert.     GCS: GCS eye subscore is 4. GCS verbal subscore is 5. GCS motor subscore is 6.     Sensory: Sensation is intact. No sensory deficit.     Motor: Motor function is intact. No weakness.     Coordination: Coordination is intact.     Comments: CN II-XII grossly intact.  Speech clear. No pronator drift.  mentating well.        ED Treatments / Results  Labs (all labs ordered are listed, but only abnormal results are displayed) Labs Reviewed - No data to display  EKG None  Radiology No results found.  Procedures Procedures (including critical care time)  Medications Ordered in ED Medications  oxyCODONE-acetaminophen (PERCOCET/ROXICET) 5-325 MG per tablet 1 tablet (1 tablet Oral Given 11/10/18 1211)     Initial Impression / Assessment and Plan / ED Course  I have reviewed the triage vital signs and the nursing notes.  Pertinent labs & imaging results that were available during my care of the patient were reviewed by me and considered in my medical decision making (see chart for details).       Pt with likely acute on chronic pain.  Requesting pain control.  NV  intact.  No focal neuro deficits.  No concerning sx's for OM.  Recommended pt to establish PCP or pain management care.  Reviewed database, offered short course of pain medication and steroid and referral info for local orthopedics.    I was informed by nursing staff that pt refused steroid injection and left dept after receiving a percocet as I was preparing her d/c instructions.  Behavior concerning for drug seeking, so I then contacted pharmacy and cancelled the prescription.     Final Clinical Impressions(s) / ED Diagnoses   Final diagnoses:  Otalgia of both ears    ED Discharge Orders    None       Kem Parkinson, PA-C 11/12/18 [place], MD 11/23/18 [phone]

## 2019-07-21 ENCOUNTER — Ambulatory Visit: Payer: Medicaid Other | Admitting: Family Medicine

## 2021-05-09 DIAGNOSIS — I6529 Occlusion and stenosis of unspecified carotid artery: Secondary | ICD-10-CM | POA: Insufficient documentation

## 2021-05-09 DIAGNOSIS — I2510 Atherosclerotic heart disease of native coronary artery without angina pectoris: Secondary | ICD-10-CM | POA: Insufficient documentation

## 2021-05-09 DIAGNOSIS — I10 Essential (primary) hypertension: Secondary | ICD-10-CM | POA: Insufficient documentation

## 2021-06-13 ENCOUNTER — Other Ambulatory Visit: Payer: Self-pay

## 2021-06-13 ENCOUNTER — Ambulatory Visit (HOSPITAL_COMMUNITY)
Admission: RE | Admit: 2021-06-13 | Discharge: 2021-06-13 | Disposition: A | Discharge status: Home / Self Care | Payer: 59 | Source: Ambulatory Visit | Attending: Cardiology | Admitting: Cardiology

## 2021-06-13 ENCOUNTER — Encounter (HOSPITAL_COMMUNITY): Payer: Self-pay | Admitting: Cardiology

## 2021-06-13 VITALS — BP 122/70 | HR 57 | Wt 126.6 lb

## 2021-06-13 DIAGNOSIS — I2510 Atherosclerotic heart disease of native coronary artery without angina pectoris: Secondary | ICD-10-CM | POA: Insufficient documentation

## 2021-06-13 DIAGNOSIS — I252 Old myocardial infarction: Secondary | ICD-10-CM | POA: Insufficient documentation

## 2021-06-13 DIAGNOSIS — I509 Heart failure, unspecified: Secondary | ICD-10-CM | POA: Diagnosis not present

## 2021-06-13 DIAGNOSIS — I110 Hypertensive heart disease with heart failure: Secondary | ICD-10-CM | POA: Diagnosis not present

## 2021-06-13 DIAGNOSIS — Z79899 Other long term (current) drug therapy: Secondary | ICD-10-CM | POA: Diagnosis not present

## 2021-06-13 DIAGNOSIS — I255 Ischemic cardiomyopathy: Secondary | ICD-10-CM | POA: Insufficient documentation

## 2021-06-13 DIAGNOSIS — F17210 Nicotine dependence, cigarettes, uncomplicated: Secondary | ICD-10-CM | POA: Diagnosis not present

## 2021-06-13 DIAGNOSIS — F1411 Cocaine abuse, in remission: Secondary | ICD-10-CM | POA: Diagnosis not present

## 2021-06-13 DIAGNOSIS — Z955 Presence of coronary angioplasty implant and graft: Secondary | ICD-10-CM | POA: Insufficient documentation

## 2021-06-13 DIAGNOSIS — E785 Hyperlipidemia, unspecified: Secondary | ICD-10-CM | POA: Insufficient documentation

## 2021-06-13 DIAGNOSIS — Z7982 Long term (current) use of aspirin: Secondary | ICD-10-CM | POA: Diagnosis not present

## 2021-06-13 DIAGNOSIS — I5021 Acute systolic (congestive) heart failure: Secondary | ICD-10-CM | POA: Diagnosis present

## 2021-06-13 DIAGNOSIS — G4700 Insomnia, unspecified: Secondary | ICD-10-CM | POA: Insufficient documentation

## 2021-06-13 DIAGNOSIS — I6522 Occlusion and stenosis of left carotid artery: Secondary | ICD-10-CM | POA: Diagnosis not present

## 2021-06-13 DIAGNOSIS — J449 Chronic obstructive pulmonary disease, unspecified: Secondary | ICD-10-CM | POA: Insufficient documentation

## 2021-06-13 LAB — BASIC METABOLIC PANEL
Anion gap: 6 (ref 5–15)
BUN: 11 mg/dL (ref 8–23)
CO2: 24 mmol/L (ref 22–32)
Calcium: 9.3 mg/dL (ref 8.9–10.3)
Chloride: 108 mmol/L (ref 98–111)
Creatinine, Ser: 0.85 mg/dL (ref 0.44–1.00)
GFR, Estimated: >60 mL/min (ref >60)
Glucose, Bld: 113 mg/dL — ABNORMAL HIGH (ref 70–99)
Potassium: 3.8 mmol/L (ref 3.5–5.1)
Sodium: 138 mmol/L (ref 135–145)

## 2021-06-13 LAB — LIPID PANEL
Cholesterol: 89 mg/dL (ref 0–200)
HDL: 40 mg/dL — ABNORMAL LOW (ref >40)
LDL Cholesterol: 40 mg/dL (ref 0–99)
Total CHOL/HDL Ratio: 2.2 RATIO
Triglycerides: 47 mg/dL (ref <150)
VLDL: 9 mg/dL (ref 0–40)

## 2021-06-13 MED ORDER — NITROGLYCERIN 0.4 MG SL SUBL: 0.4000 mg | SUBLINGUAL_TABLET | SUBLINGUAL | 3 refills | Status: AC | PRN

## 2021-06-13 MED ORDER — TRAZODONE HCL 50 MG PO TABS: 50.0000 mg | ORAL_TABLET | Freq: Every day | ORAL | 0 refills | Status: DC

## 2021-06-13 MED ORDER — DAPAGLIFLOZIN PROPANEDIOL 10 MG PO TABS: 10.0000 mg | ORAL_TABLET | Freq: Every day | ORAL | 11 refills | Status: AC

## 2021-06-13 MED ORDER — NICOTINE 14 MG/24HR TD PT24: 14.0000 mg | MEDICATED_PATCH | Freq: Every day | TRANSDERMAL | 0 refills | Status: DC

## 2021-06-13 MED ORDER — ENTRESTO 24-26 MG PO TABS: 1.0000 | ORAL_TABLET | Freq: Two times a day (BID) | ORAL | 11 refills | Status: DC

## 2021-06-13 MED ORDER — BUDESONIDE-FORMOTEROL FUMARATE 80-4.5 MCG/ACT IN AERO: 2.0000 | INHALATION_SPRAY | Freq: Two times a day (BID) | RESPIRATORY_TRACT | 0 refills | Status: AC

## 2021-06-13 NOTE — Patient Instructions (Signed)
Medication Changes: ? ?Start Farxiga 10 mg daily ? ?Stop Lisinopril, Friday start your Entresto 24/26 Twice daily ? ? ?Lab Work: ? ?Labs done today, your results will be available in MyChart, we will contact you for abnormal readings. ? ?Repeat blood work in 10-14 days  ? ?Testing/Procedures: ? ?none ? ?Referrals: ? ?Please follow up with our heart failure pharmacist in 3 weeks ? ? ?Special Instructions // Education: ? ?none ? ?Follow-Up in: 6 weeks  ? ?At the [facility], you and your health needs are our priority. We have a designated team specialized in the treatment of Heart Failure. This Care Team includes your primary Heart Failure Specialized Cardiologist (physician), Advanced Practice Providers (APPs- Physician Assistants and Nurse Practitioners), and Pharmacist who all work together to provide you with the care you need, when you need it.  ? ?You may see any of the following providers on your designated Care Team at your next follow up: ? ?Dr Arvilla Meres ?Dr Marca Ancona ?Tonye Becket, NP ?Robbie Lis, PA ?Jessica [place],NP ?Anna Genre, PA ?Karle Plumber, PharmD ? ? ?Please be sure to bring in all your medications bottles to every appointment.  ? ?Need to Contact us: ? ?If you have any questions or concerns before your next appointment please send [place] a message through [place] or call our office at [phone].   ? ?TO LEAVE A MESSAGE FOR THE NURSE SELECT OPTION 2, PLEASE LEAVE A MESSAGE INCLUDING: ?YOUR NAME ?DATE OF BIRTH ?CALL BACK NUMBER ?REASON FOR CALL**this is important as we prioritize the call backs ? ?YOU WILL RECEIVE A CALL BACK THE SAME DAY AS LONG AS YOU CALL BEFORE 4:00 PM ? ? ?

## 2021-06-14 ENCOUNTER — Telehealth (HOSPITAL_COMMUNITY): Payer: Self-pay | Admitting: *Deleted

## 2021-06-14 NOTE — Addendum Note (Signed)
Encounter addended by: Laurey Morale, MD on: 06/14/2021 12:25 AM ? Actions taken: Clinical Note Signed

## 2021-06-14 NOTE — Progress Notes (Addendum)
PCP: Margorie John, MD ?Cardiology Dr. Rockne Menghini ?HF Cardiology: Dr. Shirlee Latch ? ?66 y.o. with history of CAD, ischemic cardiomyopathy, smoking and cocaine abuse was referred by Dr. Beckie Salts for evaluation of CHF. Patient has an extensive history of CAD.  Initial event was PTCA to the LAD in 1990.  She had inferior MI in 12/10 with PCI to RCA, in 1/11 she had PCI to LAD. 2/23 cath showed occluded PDA and D1 with collaterals, no interventional target.  Last echo in 1/23 showed EF 45-50%, severe LAE, moderate MR. She is still smoking.  She has a history of cocaine abuse but says she has not used it for about 2 months.  ? ?Patient denies chest pain.  She is very nervous about her heart disease and says that she cannot fall asleep at night as she fears she will not wake up again. She is short of breath with fast walking, walking her dog.  She is short of breath with vacuuming, sweeping.  She reports orthopnea.  No PND.  No NTG use.  ? ?ECG (personally reviewed): NSR, lateral TWIs ? ?PMH: ?1. CAD: PTCA LAD 1990 ([facility]).  ?- 12/10 inferior MI with PCI to RCA. ?- 1/11 PCI to 95% stenosis LAD.  ?- 1/23 Cardiolite showed EF 44%, anterior ischemia, inferolateral infarction.  ?- LHC (2/23): totally occluded D1 with collaterals, totally occluded PDA with collaterals => no interventional target.  ?2. Carotid disease: Left CEA ?3. Hyperlipidemia ?4. HTN ?5. Cocaine abuse ?6. Chronic HF with mid range EF: Ischemic cardiomyopathy.   ?- Echo (1/23): EF 45-50%, severe LAE, moderate MR.  ?7. Active smoker ? ?Social History  ? ?Socioeconomic History  ? Marital status: Legally Separated  ?  Spouse name: Not on file  ? Number of children: 2  ? Years of education: 9th  ? Highest education level: Not on file  ?Occupational History  ? Occupation: disability  ?Tobacco Use  ? Smoking status: Some Days  ?  Packs/day: 1.00  ?  Years: 20.00  ?  Pack years: 20.00  ?  Types: Cigarettes  ? Smokeless tobacco: Never  ?Vaping Use  ? Vaping Use: Never used   ?Substance and Sexual Activity  ? Alcohol use: No  ? Drug use: No  ? Sexual activity: Yes  ?  Birth control/protection: Surgical  ?Other Topics Concern  ? Not on file  ?Social History Narrative  ? Not on file  ? ?Social Determinants of Health  ? ?Financial Resource Strain: Not on file  ?Food Insecurity: Not on file  ?Transportation Needs: Not on file  ?Physical Activity: Not on file  ?Stress: Not on file  ?Social Connections: Not on file  ?Intimate Partner Violence: Not on file  ? ?Family History  ?Problem Relation Age of Onset  ? Diabetes Mother   ? Dementia Mother   ? Stroke Mother   ? Cirrhosis Father   ? Diabetes Sister   ? ?ROS: All systems reviewed and negative except as per HPI.  ? ?Current Outpatient Medications  ?Medication Sig Dispense Refill  ? albuterol (VENTOLIN HFA) 108 (90 Base) MCG/ACT inhaler Inhale 2 puffs into the lungs every 6 (six) hours as needed for wheezing or shortness of breath.     ? aspirin EC 81 MG tablet Take 81 mg by mouth every morning.     ? budesonide-formoterol (SYMBICORT) 80-4.5 MCG/ACT inhaler Inhale 2 puffs into the lungs 2 (two) times daily. 1 each 0  ? carvedilol (COREG) 6.25 MG tablet Take 6.25 mg  by mouth 2 (two) times daily with a meal.    ? dapagliflozin propanediol (FARXIGA) 10 MG TABS tablet Take 1 tablet (10 mg total) by mouth daily before breakfast. 30 tablet 11  ? isosorbide mononitrate (IMDUR) 30 MG 24 hr tablet Take 30 mg by mouth daily.    ? nicotine (NICODERM CQ - DOSED IN MG/24 HOURS) 14 mg/24hr patch Place 1 patch (14 mg total) onto the skin daily. 28 patch 0  ? nitroGLYCERIN (NITROSTAT) 0.4 MG SL tablet Place 1 tablet (0.4 mg total) under the tongue every 5 (five) minutes as needed for chest pain. 90 tablet 3  ? rosuvastatin (CRESTOR) 20 MG tablet Take 20 mg by mouth daily.    ? sacubitril-valsartan (ENTRESTO) 24-26 MG Take 1 tablet by mouth 2 (two) times daily. 60 tablet 11  ? traZODone (DESYREL) 50 MG tablet Take 1 tablet (50 mg total) by mouth at bedtime.  30 tablet 0  ? ?No current facility-administered medications for this encounter.  ? ?BP 122/70   Pulse (!) 57   Wt 57.4 kg (126 lb 9.6 oz)   SpO2 97%   BMI 23.92 kg/m?  ?General: NAD ?Neck: No JVD, no thyromegaly or thyroid nodule.  ?Lungs: Distant BS ?CV: Nondisplaced PMI.  Heart regular S1/S2, no S3/S4, no murmur.  No peripheral edema.  No carotid bruit.  Unable to palpate pedal pulses.  ?Abdomen: Soft, nontender, no hepatosplenomegaly, no distention.  ?Skin: Intact without lesions or rashes.  ?Neurologic: Alert and oriented x 3.  ?Psych: Normal affect. ?Extremities: No clubbing or cyanosis.  ?HEENT: Normal.  ? ?1. Acute HF with mid range EF: Ischemic cardiomyopathy.  She is not volume overloaded on exam though she has NYHA class III symptoms.  I wonder if some of her symptomatology is not coming from COPD.  ?- Stop lisinopril, after 36 hrs start Entresto 24/26 bid.  BMET today and again in 10 days.  ?- Start Farxiga 10 mg daily.  ?- Continue Coreg 6.25 mg bid.  ?- Next step will be spironolactone.  ?2. CAD: Last cath in 2/23 with occluded PDA and D1 with collaterals.  No interventional target.  No chest pain.  ?- Continue ASA 81 daily.  ?- Continue Crestor, check lipids today.  ?- She is on Imdur, may be able to stop in future with absence of chest pain.  ?3. Smoking/COPD: I strongly encouraged her to quit.  ?- Use nicotine patches.  ?- I will give her a prescription for Symbicort as this helps her breathing considerably.  ?- Needs eventual PFTs.  ?4. Carotid stenosis: Left CEA.  She will need followup dopplers.  ?5. Insomnia: Trazodone 50 mg po qhs prn.  ? ?Followup 3 wks HF pharmacist, 6 wks APP.  ? ?Emily Patterson ?06/14/2021 ? ? ?

## 2021-06-14 NOTE — Telephone Encounter (Signed)
Pt called to ask if she still needs lisinopril since she was prescribed entresto. I advised pt to stop lisinopril. Pt verbalized understanding and thanked me for the call.  ?

## 2021-06-18 ENCOUNTER — Telehealth: Payer: Self-pay | Admitting: Physician Assistant

## 2021-06-18 NOTE — Telephone Encounter (Signed)
? ?  The patient called the answering service after-hours today. Initially when I called back the line was busy after several rings. Tried again later and had success. Recently established care with Dr. Shirlee Latch 06/13/21 for CHF. She states Dr. Shirlee Latch asked her about leg pain and she told him no but when she got to thinking about it this weekend, she does occasionally feel leg pain. She describes that this only occurs when she crosses one foot under the other calf and then vice versa on the other side, essentially like sitting cross legged but lying down on the couch (frog legged). She feels a discomfort in her groin when she does this. Otherwise no calf pain, swelling, erythema, chest pain, dyspnea. She feels great today. She was worried this might represent a blood clot. Has been going on 2-3 months. She has absolutely no leg pain except in this position in the groin area as described. I told her I do not feel acutely concerned about this from a heart standpoint and would encourage her to see her PCP for evaluation.  ? ?She wanted to make sure Dr. Shirlee Latch was aware so will route to him as FYI only. No response needed unless he has additional recommendations, though patient knows to discuss with primary care. ? ?The patient verbalized understanding and gratitude. ? ?Laurann Montana, PA-C ? ?

## 2021-07-09 NOTE — Progress Notes (Incomplete)
***In Progress*** ? ?  ?[facility] Note  ? ?PCP: Margorie John, MD ?Cardiology Dr. Rockne Menghini ?HF Cardiology: Dr. Shirlee Latch ? ?HPI:  ?66 y.o. with history of CAD, ischemic cardiomyopathy, smoking and cocaine abuse was referred by Dr. Beckie Salts for evaluation of CHF. Patient has an extensive history of CAD.  Initial event was PTCA to the LAD in 1990.  She had inferior MI in 02/2009 with PCI to RCA, in 03/2009 she had PCI to LAD. 04/2021 cath showed occluded PDA and D1 with collaterals, no interventional target.  Last echo in 03/2021 showed EF 45-50%, severe LAE, moderate MR. She is still smoking.  She has a history of cocaine abuse but says she has not used it for about 2 months.  ?  ?Returned for follow up with MD 06/13/21. Patient denied chest pain.  She was very nervous about her heart disease and said that she could not fall asleep at night as she feared she would not wake up again. She reported shortness of breath with fast walking, walking her dog.  She reported shortness of breath with vacuuming, sweeping.  She reported orthopnea.  No PND.  No NTG use.  ? ?Today she returns to HF clinic for pharmacist medication titration. At last visit with MD Marcelline Deist 10 mg daily was started.  ? ?Shortness of breath/dyspnea on exertion? {YES NO:22349}  ?Orthopnea/PND? {YES NO:22349} ?Edema? {YES NO:22349} ?Lightheadedness/dizziness? {YES NO:22349} ?Daily weights at home? {YES NO:22349} ?Blood pressure/heart rate monitoring at home? {YES NO:22349} ?Following low-sodium/fluid-restricted diet? {YES NO:22349} ? ?HF Medications: ?Carvedilol 6.25 mg BID ?Entresto 24/26 mg BID ?Farxiga 10 mg daily ?Imdur 30 mg daily ? ?Has the patient been experiencing any side effects to the medications prescribed?  {YES NO:22349} ? ?Does the patient have any problems obtaining medications due to transportation or finances?   [employer] Medicare/[place] Medicaid ? ?Understanding of regimen:  {excellent/good/fair/poor:19665} ?Understanding of indications: {excellent/good/fair/poor:19665} ?Potential of compliance: {excellent/good/fair/poor:19665} ?Patient understands to avoid NSAIDs. ?Patient understands to avoid decongestants. ?  ? ?Pertinent Lab Values: ?06/13/21: Serum creatinine 0.85, BUN 11, Potassium 3.8, Sodium 138 ? ?Vital Signs: ?Weight: *** (last clinic weight: 126 lbs) ?Blood pressure: *** 122/70 ?Heart rate: *** 57 ? ?Plan: BMET today? (F/u from switch to entresto, baseline before starting spiro) ?A: Initiate spironolactone 25 mg daily (has the BP and K room to tolerate?) ?B: Increase entresto to 49/51 mg daily ?C: Switch carvedilol to bisoprolol 5 mg daily ? ?Assessment/Plan: ?1. Acute HF with mid range EF: Ischemic cardiomyopathy.  She is not volume overloaded on exam though she has NYHA class III symptoms.  I wonder if some of her symptomatology is not coming from COPD.  ?- Continue carvedilol 6/25 mg BID ?- Continue Entresto 24/26 mg BID ?- Continue Farxiga 10 mg daily.  ?- Next step will be spironolactone.  ? ?2. CAD: Last cath in 04/2021 with occluded PDA and D1 with collaterals.  No interventional target.  No chest pain.  ?- Continue ASA 81 daily.  ?- Continue Crestor ?- She is on Imdur, may be able to stop in future with absence of chest pain.  ? ?3. Smoking/COPD: I strongly encouraged her to quit.  ?- Use nicotine patches.  ?- Continue Symbicort ?- Needs eventual PFTs.  ? ?4. Carotid stenosis: Left CEA.  She will need followup dopplers.  ? ?5. Insomnia: Trazodone 50 mg po qhs prn.  ? ?Follow up in three weeks with APP clinic. ? ? ?Karle Plumber, PharmD, BCPS, BCCP, CPP ?Heart Failure  Clinic Pharmacist ?[phone] ? ? ?

## 2021-07-10 ENCOUNTER — Inpatient Hospital Stay (HOSPITAL_COMMUNITY): Admission: RE | Admit: 2021-07-10 | Payer: 59 | Source: Ambulatory Visit

## 2021-07-21 ENCOUNTER — Telehealth (HOSPITAL_COMMUNITY): Payer: Self-pay | Admitting: Family Medicine

## 2021-07-27 ENCOUNTER — Encounter (HOSPITAL_COMMUNITY): Payer: 59

## 2021-08-15 NOTE — Progress Notes (Incomplete)
PCP: Chiquita Loth, MD Cardiology Dr. Rosalita Chessman HF Cardiology: Dr. Aundra Dubin  66 y.o. with history of CAD, ischemic cardiomyopathy, smoking and cocaine abuse was referred by Dr. Cyndie Mull for evaluation of CHF. Patient has an extensive history of CAD.  Initial event was PTCA to the LAD in 1990.  She had inferior MI in 12/10 with PCI to RCA, in 1/11 she had PCI to LAD. 2/23 cath showed occluded PDA and D1 with collaterals, no interventional target.  Last echo in 1/23 showed EF 45-50%, severe LAE, moderate MR. She is still smoking.  She has a history of cocaine abuse but says she has not used it for about 2 months.   Patient denies chest pain.  She is very nervous about her heart disease and says that she cannot fall asleep at night as she fears she will not wake up again. She is short of breath with fast walking, walking her dog.  She is short of breath with vacuuming, sweeping.  She reports orthopnea.  No PND.  No NTG use.   ECG (personally reviewed): NSR, lateral TWIs  PMH: 1. CAD: PTCA LAD 1990 ([facility]).  - 12/10 inferior MI with PCI to RCA. - 1/11 PCI to 95% stenosis LAD.  - 1/23 Cardiolite showed EF 44%, anterior ischemia, inferolateral infarction.  - LHC (2/23): totally occluded D1 with collaterals, totally occluded PDA with collaterals => no interventional target.  2. Carotid disease: Left CEA 3. Hyperlipidemia 4. HTN 5. Cocaine abuse 6. Chronic HF with mid range EF: Ischemic cardiomyopathy.   - Echo (1/23): EF 45-50%, severe LAE, moderate MR.  7. Active smoker  Social History   Socioeconomic History   Marital status: Legally Separated    Spouse name: Not on file   Number of children: 2   Years of education: 9th   Highest education level: Not on file  Occupational History   Occupation: disability  Tobacco Use   Smoking status: Some Days    Packs/day: 1.00    Years: 20.00    Pack years: 20.00    Types: Cigarettes   Smokeless tobacco: Never  Vaping Use   Vaping Use: Never used   Substance and Sexual Activity   Alcohol use: No   Drug use: No   Sexual activity: Yes    Birth control/protection: Surgical  Other Topics Concern   Not on file  Social History Narrative   Not on file   Social Determinants of Health   Financial Resource Strain: Not on file  Food Insecurity: Not on file  Transportation Needs: Not on file  Physical Activity: Not on file  Stress: Not on file  Social Connections: Not on file  Intimate Partner Violence: Not on file   Family History  Problem Relation Age of Onset   Diabetes Mother    Dementia Mother    Stroke Mother    Cirrhosis Father    Diabetes Sister    ROS: All systems reviewed and negative except as per HPI.   Current Outpatient Medications  Medication Sig Dispense Refill   albuterol (VENTOLIN HFA) 108 (90 Base) MCG/ACT inhaler Inhale 2 puffs into the lungs every 6 (six) hours as needed for wheezing or shortness of breath.      aspirin EC 81 MG tablet Take 81 mg by mouth every morning.      budesonide-formoterol (SYMBICORT) 80-4.5 MCG/ACT inhaler Inhale 2 puffs into the lungs 2 (two) times daily. 1 each 0   carvedilol (COREG) 6.25 MG tablet Take 6.25 mg  by mouth 2 (two) times daily with a meal.     dapagliflozin propanediol (FARXIGA) 10 MG TABS tablet Take 1 tablet (10 mg total) by mouth daily before breakfast. 30 tablet 11   isosorbide mononitrate (IMDUR) 30 MG 24 hr tablet Take 30 mg by mouth daily.     nicotine (NICODERM CQ - DOSED IN MG/24 HOURS) 14 mg/24hr patch Place 1 patch (14 mg total) onto the skin daily. 28 patch 0   nitroGLYCERIN (NITROSTAT) 0.4 MG SL tablet Place 1 tablet (0.4 mg total) under the tongue every 5 (five) minutes as needed for chest pain. 90 tablet 3   rosuvastatin (CRESTOR) 20 MG tablet Take 20 mg by mouth daily.     sacubitril-valsartan (ENTRESTO) 24-26 MG Take 1 tablet by mouth 2 (two) times daily. 60 tablet 11   traZODone (DESYREL) 50 MG tablet Take 1 tablet (50 mg total) by mouth at bedtime.  30 tablet 0   No current facility-administered medications for this visit.   There were no vitals taken for this visit. General: NAD Neck: No JVD, no thyromegaly or thyroid nodule.  Lungs: Distant BS CV: Nondisplaced PMI.  Heart regular S1/S2, no S3/S4, no murmur.  No peripheral edema.  No carotid bruit.  Unable to palpate pedal pulses.  Abdomen: Soft, nontender, no hepatosplenomegaly, no distention.  Skin: Intact without lesions or rashes.  Neurologic: Alert and oriented x 3.  Psych: Normal affect. Extremities: No clubbing or cyanosis.  HEENT: Normal.   1. Acute HF with mid range EF: Ischemic cardiomyopathy.  She is not volume overloaded on exam though she has NYHA class III symptoms.  I wonder if some of her symptomatology is not coming from COPD.  - Stop lisinopril, after 36 hrs start Entresto 24/26 bid.  BMET today and again in 10 days.  - Start Farxiga 10 mg daily.  - Continue Coreg 6.25 mg bid.  - Next step will be spironolactone.  2. CAD: Last cath in 2/23 with occluded PDA and D1 with collaterals.  No interventional target.  No chest pain.  - Continue ASA 81 daily.  - Continue Crestor, check lipids today.  - She is on Imdur, may be able to stop in future with absence of chest pain.  3. Smoking/COPD: I strongly encouraged her to quit.  - Use nicotine patches.  - I will give her a prescription for Symbicort as this helps her breathing considerably.  - Needs eventual PFTs.  4. Carotid stenosis: Left CEA.  She will need followup dopplers.  5. Insomnia: Trazodone 50 mg po qhs prn.   Followup 3 wks HF pharmacist, 6 wks APP.   [place] 08/15/2021

## 2021-08-16 ENCOUNTER — Encounter (HOSPITAL_COMMUNITY): Payer: 59

## 2021-08-16 ENCOUNTER — Telehealth (HOSPITAL_COMMUNITY): Payer: Self-pay

## 2021-08-16 NOTE — Telephone Encounter (Signed)
LATE ENTRY:  Called and spoke to patient's husband yesterday to confirm/remind patient of their appointment at the [facility] on 08/16/21.   Patient reminded to bring all medications and/or complete list.  Confirmed patient has transportation. Gave directions, instructed to utilize [place] parking.  Confirmed appointment prior to ending call.

## 2021-10-19 ENCOUNTER — Telehealth (HOSPITAL_COMMUNITY): Payer: Self-pay

## 2021-10-19 NOTE — Telephone Encounter (Signed)
Called and was unable to leave patient a voice message to confirm/remind patient of their appointment at the [facility] on 10/20/21.

## 2021-10-20 ENCOUNTER — Encounter (HOSPITAL_COMMUNITY): Payer: 59

## 2022-02-05 DIAGNOSIS — R0600 Dyspnea, unspecified: Secondary | ICD-10-CM | POA: Insufficient documentation

## 2022-02-23 ENCOUNTER — Telehealth: Payer: Self-pay | Admitting: Orthopaedic Surgery

## 2022-02-23 NOTE — Telephone Encounter (Signed)
Tried to returned the patient's call, but the number on file is non-working.

## 2022-02-27 ENCOUNTER — Ambulatory Visit: Payer: 59 | Admitting: Orthopaedic Surgery

## 2022-05-02 DIAGNOSIS — R609 Edema, unspecified: Secondary | ICD-10-CM | POA: Insufficient documentation

## 2022-05-07 ENCOUNTER — Other Ambulatory Visit (HOSPITAL_COMMUNITY): Payer: Self-pay

## 2022-05-07 ENCOUNTER — Telehealth (HOSPITAL_COMMUNITY): Payer: Self-pay

## 2022-05-07 NOTE — Telephone Encounter (Signed)
[facility] Patient Advocate Encounter  Received notification that prior authorization is required for dapagliflozin. Review of this plan shows that brand is currently preferred. Test claim for Farxiga (DAW 9) returns $0 copay for 90 day supply.  Prior authorization not submitted at this time.  Clista Bernhardt, CPhT Rx Patient Advocate Phone: [phone]

## 2022-06-19 NOTE — Telephone Encounter (Signed)
Encounter opened in error

## 2022-07-04 NOTE — H&P (Signed)
Surgical History & Physical  Patient Name: Emily Patterson  DOB: [birth year]  Surgery: Cataract extraction with intraocular lens implant phacoemulsification; Right Eye Surgeon: Pecolia Ades MD Surgery Date: 07/13/2022 Pre-Op Date: 06/13/2022  HPI: A 45 Yr. old female patient present for cataract eval per Dr. Daphine Deutscher. 1. The patient complains of difficulty when driving due to glare from headlights or sun, watching TV reading captions, reading fine print which began 2 months ago. Both eyes are affected, OS>OD. The episode is constant. The condition's severity is worsening.  Medical History: Cataracts Heart Problem High Blood Pressure LDL  Review of Systems Lung Problems Cardiovascular High Blood Pressure Respiratory Asthma All recorded systems are negative except as noted above.  Social Current some day smoker of Cigarettes   Medication Formoterol furnarate inhaler, Yupelri inhaler, Alprazolam, Carvedilol, Dapagliflozin, Entresto, Farxiga, Furosemide, Buprenorphine-Naloxone, Nitroglycerin, Suboxone   Sx/Procedures Heart stents, Tubal Ligation   Drug Allergies  Latex  History & Physical: Heent: cataracts NECK: supple without bruits LUNGS: lungs clear to auscultation CV: regular rate and rhythm Abdomen: soft and non-tender  Impression & Plan: Assessment: 1.  Myopia ; Both Eyes (H52.13) 2.  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  DRY EYE SYNDROME/TEAR FILM INSUFFICIENCY; Both Eyes (H04.123) 4.  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  hold on glasses for now  2.  Cataracts are visually significant and account for the patient's complaints. Discussed all risks, benefits, procedures and recovery, including infection, loss of vision and eye, need for glasses after surgery or additional procedures. Patient understands changing glasses will not improve vision. Patient indicated understanding of procedure. All questions  answered. Patient desires to have surgery, recommend phacoemulsification with intraocular lens. Patient to have preliminary testing necessary (Argos/IOL Master, Mac OCT, TOPO) Educational materials provided.  Plan: - proceed with cataract surgery OD, followed by OS - target best distance - RayOne lens - ok with lying flat - no prior eye surgeries  3.  Findings, prognosis and treatment options reviewed. Begin basic treatment regimen; warm compresses, lid scrubs, 1000-2000mg  omega 3 fatty acids, and preserved artificial tears QID.  4.  small pigmented nevus on caruncle OD, 4 mm in size with normal architecture and hair growth from it. Monitor.

## 2022-07-09 ENCOUNTER — Encounter (HOSPITAL_COMMUNITY)
Admission: RE | Admit: 2022-07-09 | Discharge: 2022-07-09 | Disposition: A | Discharge status: Home / Self Care | Payer: 59 | Source: Ambulatory Visit | Attending: Optometry | Admitting: Optometry

## 2022-07-13 ENCOUNTER — Ambulatory Visit (HOSPITAL_COMMUNITY)
Admission: RE | Admit: 2022-07-13 | Discharge: 2022-07-13 | Disposition: A | Discharge status: Home / Self Care | Payer: 59 | Attending: Optometry | Admitting: Optometry

## 2022-07-13 ENCOUNTER — Ambulatory Visit (HOSPITAL_COMMUNITY): Payer: 59 | Admitting: Anesthesiology

## 2022-07-13 ENCOUNTER — Ambulatory Visit (HOSPITAL_BASED_OUTPATIENT_CLINIC_OR_DEPARTMENT_OTHER): Payer: 59 | Admitting: Anesthesiology

## 2022-07-13 ENCOUNTER — Encounter (HOSPITAL_COMMUNITY): Payer: Self-pay | Admitting: Optometry

## 2022-07-13 ENCOUNTER — Encounter (HOSPITAL_COMMUNITY)
Admission: RE | Disposition: A | Discharge status: Home / Self Care | Payer: Self-pay | Source: Home / Self Care | Attending: Optometry

## 2022-07-13 DIAGNOSIS — H5213 Myopia, bilateral: Secondary | ICD-10-CM | POA: Diagnosis not present

## 2022-07-13 DIAGNOSIS — Z955 Presence of coronary angioplasty implant and graft: Secondary | ICD-10-CM | POA: Diagnosis not present

## 2022-07-13 DIAGNOSIS — I252 Old myocardial infarction: Secondary | ICD-10-CM | POA: Diagnosis not present

## 2022-07-13 DIAGNOSIS — I509 Heart failure, unspecified: Secondary | ICD-10-CM

## 2022-07-13 DIAGNOSIS — F17210 Nicotine dependence, cigarettes, uncomplicated: Secondary | ICD-10-CM | POA: Insufficient documentation

## 2022-07-13 DIAGNOSIS — H2511 Age-related nuclear cataract, right eye: Secondary | ICD-10-CM

## 2022-07-13 DIAGNOSIS — I110 Hypertensive heart disease with heart failure: Secondary | ICD-10-CM | POA: Diagnosis not present

## 2022-07-13 HISTORY — PX: CATARACT EXTRACTION W/PHACO: SHX586

## 2022-07-13 LAB — GLUCOSE, CAPILLARY: Glucose-Capillary: 94 mg/dL (ref 70–99)

## 2022-07-13 SURGERY — PHACOEMULSIFICATION, CATARACT, WITH IOL INSERTION
Anesthesia: Monitor Anesthesia Care | Site: Eye | Laterality: Right

## 2022-07-13 MED ORDER — SIGHTPATH DOSE#1 NA HYALUR & NA CHOND-NA HYALUR IO KIT
PACK | INTRAOCULAR | Status: DC | PRN
  Administered 2022-07-13: 1 via OPHTHALMIC

## 2022-07-13 MED ORDER — BSS IO SOLN
INTRAOCULAR | Status: DC | PRN
  Administered 2022-07-13: 15 mL via INTRAOCULAR

## 2022-07-13 MED ORDER — PHENYLEPHRINE-KETOROLAC 1-0.3 % IO SOLN
INTRAOCULAR | Status: DC | PRN
  Administered 2022-07-13: 500 mL via OPHTHALMIC

## 2022-07-13 MED ORDER — POVIDONE-IODINE 5 % OP SOLN
OPHTHALMIC | Status: DC | PRN
  Administered 2022-07-13: 1 via OPHTHALMIC

## 2022-07-13 MED ORDER — TROPICAMIDE 1 % OP SOLN
1.0000 [drp] | OPHTHALMIC | Status: AC
  Administered 2022-07-13 (×3): 1 [drp] via OPHTHALMIC

## 2022-07-13 MED ORDER — NEOMYCIN-POLYMYXIN-DEXAMETH 3.5-10000-0.1 OP SUSP
OPHTHALMIC | Status: DC | PRN
  Administered 2022-07-13: 2 [drp] via OPHTHALMIC

## 2022-07-13 MED ORDER — STERILE WATER FOR IRRIGATION IR SOLN
Status: DC | PRN
  Administered 2022-07-13: 250 mL

## 2022-07-13 MED ORDER — PHENYLEPHRINE-KETOROLAC 1-0.3 % IO SOLN
INTRAOCULAR | Status: AC
  Filled 2022-07-13: qty 4

## 2022-07-13 MED ORDER — LIDOCAINE HCL 3.5 % OP GEL
1.0000 | Freq: Once | OPHTHALMIC | Status: AC
  Administered 2022-07-13: 1 via OPHTHALMIC

## 2022-07-13 MED ORDER — PHENYLEPHRINE HCL 2.5 % OP SOLN
1.0000 [drp] | OPHTHALMIC | Status: AC
  Administered 2022-07-13 (×3): 1 [drp] via OPHTHALMIC

## 2022-07-13 MED ORDER — MIDAZOLAM HCL 2 MG/2ML IJ SOLN
INTRAMUSCULAR | Status: AC
  Filled 2022-07-13: qty 2

## 2022-07-13 MED ORDER — LIDOCAINE HCL (PF) 1 % IJ SOLN
INTRAMUSCULAR | Status: DC | PRN
  Administered 2022-07-13: 1 mL

## 2022-07-13 MED ORDER — MIDAZOLAM HCL 5 MG/5ML IJ SOLN
INTRAMUSCULAR | Status: DC | PRN
  Administered 2022-07-13: 2 mg via INTRAVENOUS

## 2022-07-13 MED ORDER — TETRACAINE HCL 0.5 % OP SOLN
1.0000 [drp] | OPHTHALMIC | Status: AC
  Administered 2022-07-13 (×3): 1 [drp] via OPHTHALMIC

## 2022-07-13 SURGICAL SUPPLY — 15 items
CATARACT SUITE SIGHTPATH (MISCELLANEOUS) ×1 IMPLANT
CLOTH BEACON ORANGE TIMEOUT ST (SAFETY) ×1 IMPLANT
DRSG TEGADERM 4X4.75 (GAUZE/BANDAGES/DRESSINGS) ×1 IMPLANT
EYE SHIELD UNIVERSAL CLEAR (GAUZE/BANDAGES/DRESSINGS) IMPLANT
FEE CATARACT SUITE SIGHTPATH (MISCELLANEOUS) ×1 IMPLANT
GLOVE BIOGEL PI IND STRL 7.0 (GLOVE) ×2 IMPLANT
LENS IOL RAYNER 22.5 (Intraocular Lens) ×1 IMPLANT
LENS IOL RAYONE EMV 22.5 (Intraocular Lens) IMPLANT
NDL HYPO 18GX1.5 BLUNT FILL (NEEDLE) ×1 IMPLANT
NEEDLE HYPO 18GX1.5 BLUNT FILL (NEEDLE) ×1 IMPLANT
PAD ARMBOARD 7.5X6 YLW CONV (MISCELLANEOUS) ×1 IMPLANT
RING MALYGIN 7.0 (MISCELLANEOUS) IMPLANT
SYR TB 1ML LL NO SAFETY (SYRINGE) ×1 IMPLANT
TAPE SURG TRANSPORE 1 IN (GAUZE/BANDAGES/DRESSINGS) IMPLANT
WATER STERILE IRR 250ML POUR (IV SOLUTION) ×1 IMPLANT

## 2022-07-13 NOTE — Anesthesia Postprocedure Evaluation (Signed)
Anesthesia Post Note  Patient: Emily Patterson  Procedure(s) Performed: CATARACT EXTRACTION PHACO AND INTRAOCULAR LENS PLACEMENT (IOC) (Right: Eye)  Patient location during evaluation: Phase II Anesthesia Type: MAC Level of consciousness: awake and alert and oriented Pain management: pain level controlled Vital Signs Assessment: post-procedure vital signs reviewed and stable Respiratory status: spontaneous breathing, nonlabored ventilation and respiratory function stable Cardiovascular status: blood pressure returned to baseline and stable Postop Assessment: no apparent nausea or vomiting Anesthetic complications: no  No notable events documented.   Last Vitals:  Vitals:   07/13/22 0845 07/13/22 0947  BP:  (!) 94/58  Pulse: (!) 45 (!) 47  Resp: 10 14  Temp:  36.4 C  SpO2: 99% 98%    Last Pain:  Vitals:   07/13/22 0947  TempSrc: Oral  PainSc: 0-No pain                 Braylee Lal C Heiley Shaikh

## 2022-07-13 NOTE — Discharge Instructions (Signed)
Please discharge patient when stable, will follow up today with Dr. Burris Matherne at the [facility] office immediately following discharge.  Leave shield in place until visit.  All paperwork with discharge instructions will be given at the office.   [facility] Address:  [address]  , [place] [address]  Dr. Rossi Burdo's Phone: [phone]  

## 2022-07-13 NOTE — Op Note (Signed)
Date of procedure: 07/13/22  Pre-operative diagnosis: Visually significant age-related nuclear cataract, Right Eye (H25.11)  Post-operative diagnosis: Visually significant age-related nuclear cataract, Right Eye  Procedure: Removal of cataract via phacoemulsification and insertion of intra-ocular lens Rayner [id number] +22.5D into the capsular bag of the Right Eye  Attending surgeon: Pecolia Ades, MD  Anesthesia: MAC, Topical Akten  Complications: None  Estimated Blood Loss: <58mL (minimal)  Specimens: None  Implants:  Implant Name Type Inv. Item Serial No. Manufacturer Lot No. LRB No. Used Action  LENS IOL RAYNER 22.5 - [id number] Intraocular Lens LENS IOL RAYNER 22.5  SIGHTPATH [id number] Right 1 Implanted    Indications:  Visually significant age-related cataract, Right Eye  Procedure:  The patient was seen and identified in the pre-operative area. The operative eye was identified and dilated.  The operative eye was marked.  Topical anesthesia was administered to the operative eye.     The patient was then to the operative suite and placed in the supine position.  A timeout was performed confirming the patient, procedure to be performed, and all other relevant information.   The patient's face was prepped and draped in the usual fashion for intra-ocular surgery.  A lid speculum was placed into the operative eye and the surgical microscope moved into place and focused.  A superotemporal paracentesis was created using a 20 gauge paracentesis blade.  BSS mixed with Omidria, followed by 1% lidocaine was injected into the anterior chamber.  Viscoelastic was injected into the anterior chamber.  A temporal clear-corneal main wound incision was created using a 2.30mm microkeratome.  A continuous curvilinear capsulorrhexis was initiated using an irrigating cystitome and completed using capsulorrhexis forceps.  Hydrodissection and hydrodeliniation were performed.  Viscoelastic was injected into  the anterior chamber.  A phacoemulsification handpiece and a chopper as a second instrument were used to remove the nucleus and epinucleus. The irrigation/aspiration handpiece was used to remove any remaining cortical material.   The capsular bag was reinflated with viscoelastic, checked, and found to be intact.  The intraocular lens was inserted into the capsular bag.  The irrigation/aspiration handpiece was used to remove any remaining viscoelastic.  The clear corneal wound and paracentesis wounds were then hydrated and checked with Weck-Cels to be watertight.  The lid-speculum and drape was removed, and the patient's face was cleaned with a wet and dry 4x4.  Moxifloxacin drops were instilled onto the eye. A clear shield was taped over the eye. The patient was taken to the post-operative care unit in good condition, having tolerated the procedure well.  Post-Op Instructions: The patient will follow [facility] for a same day post-operative evaluation and will receive all other orders and instructions.

## 2022-07-13 NOTE — Transfer of Care (Signed)
Immediate Anesthesia Transfer of Care Note  Patient: Emily Patterson  Procedure(s) Performed: CATARACT EXTRACTION PHACO AND INTRAOCULAR LENS PLACEMENT (IOC) (Right: Eye)  Patient Location: PACU  Anesthesia Type:MAC  Level of Consciousness: awake, alert , oriented, and patient cooperative  Airway & Oxygen Therapy: Patient Spontanous Breathing  Post-op Assessment: Report given to RN, Post -op Vital signs reviewed and stable, and Patient moving all extremities X 4  Post vital signs: Reviewed and stable  Last Vitals:  Vitals Value Taken Time  BP 94/57   Temp 97.6   Pulse 50   Resp 10   SpO2 99     Last Pain:  Vitals:   07/13/22 0830  PainSc: 0-No pain         Complications: No notable events documented.

## 2022-07-13 NOTE — Interval H&P Note (Signed)
History and Physical Interval Note:  07/13/2022 8:53 AM  The H and P was reviewed and updated. The patient was examined.  No changes were found after exam.  The surgical eye was marked.  Emily Patterson

## 2022-07-13 NOTE — Anesthesia Preprocedure Evaluation (Addendum)
Anesthesia Evaluation  Patient identified by MRN, date of birth, ID band Patient awake    Reviewed: Allergy & Precautions, H&P , NPO status , Patient's Chart, lab work & pertinent test results, reviewed documented beta blocker date and time   Airway Mallampati: II  TM Distance: >3 FB Neck ROM: Full    Dental  (+) Dental Advisory Given, Upper Dentures, Edentulous Lower   Pulmonary Current Smoker and Patient abstained from smoking.   Pulmonary exam normal breath sounds clear to auscultation       Cardiovascular hypertension, Pt. on medications and Pt. on home beta blockers + Past MI, + Cardiac Stents, + Peripheral Vascular Disease (carotid sx) and +CHF  Normal cardiovascular exam Rhythm:Regular Rate:Normal  13-Jun-2021 15:49:34 [facility]-[id number] ROUTINE RECORD [birth year] (66 yr) Female Caucasian Vent. rate 54 BPM PR interval 172 ms QRS duration 102 ms QT/QTcB 468/443 ms P-R-T axes 53 66 98 Sinus bradycardia Nonspecific T wave abnormality Abnormal ECG When compared with ECG of 04-May-2012 11:36, Vent. rate has decreased BY 36 BPM Nonspecific T wave abnormality now evident in Lateral leads Otherwise no significant change Confirmed by Bryan Lemma ([id number]) on 06/14/2021 12:03:21 AM   Neuro/Psych  Headaches CVA  negative psych ROS   GI/Hepatic negative GI ROS, Neg liver ROS,,,  Endo/Other  negative endocrine ROS    Renal/GU negative Renal ROS  negative genitourinary   Musculoskeletal negative musculoskeletal ROS (+)    Abdominal   Peds negative pediatric ROS (+)  Hematology negative hematology ROS (+)   Anesthesia Other Findings   Reproductive/Obstetrics negative OB ROS                             Anesthesia Physical Anesthesia Plan  ASA: 3  Anesthesia Plan: MAC   Post-op Pain Management: Minimal or no pain anticipated   Induction: Intravenous  PONV Risk Score  and Plan: Treatment may vary due to age or medical condition  Airway Management Planned: Nasal Cannula and Natural Airway  Additional Equipment:   Intra-op Plan:   Post-operative Plan:   Informed Consent: I have reviewed the patients History and Physical, chart, labs and discussed the procedure including the risks, benefits and alternatives for the proposed anesthesia with the patient or authorized representative who has indicated his/her understanding and acceptance.     Dental advisory given  Plan Discussed with: CRNA and Surgeon  Anesthesia Plan Comments:        Anesthesia Quick Evaluation

## 2022-07-18 ENCOUNTER — Encounter (HOSPITAL_COMMUNITY): Payer: Self-pay | Admitting: Optometry

## 2022-07-18 NOTE — H&P (Signed)
Surgical History & Physical  Patient Name: Emily Patterson  DOB: 1956/02/05  Surgery: Cataract extraction with intraocular lens implant phacoemulsification; Left Eye Surgeon: Pecolia Ades MD Surgery Date: 07/27/2022 Pre-Op Date: 06/13/2022  HPI: A 54 Yr. old female patient present for cataract eval per Dr. Daphine Deutscher. 1. The patient complains of difficulty when driving due to glare from headlights or sun, watching TV reading captions, reading fine print which began 2 months ago, left eye.  The episode is constant. The condition's severity is worsening.  Medical History: Cataract Heart Problem High Blood Pressure LDL Lung Problems  Review of Systems Cardiovascular High Blood Pressure Respiratory Asthma All recorded systems are negative except as noted above.  Social Current some day smoker of Cigarettes   Medication Moxifloxacin, Prednisolone-Moxifloxacin-Bromfenac,  Formoterol furnarate inhaler, Yupelri inhaler, Alprazolam, Carvedilol, Dapagliflozin, Entresto, Farxiga, Furosemide, Buprenorphine-Naloxone, Nitroglycerin, Suboxone   Sx/Procedures Phaco c IOL OD,  Heart stents, Tubal Ligation  Drug Allergies  Latex  ROS: History & Physical: Heent: PCL OD, cataract OS NECK: supple without bruits LUNGS: lungs clear to auscultation CV: regular rate and rhythm Abdomen: soft and non-tender  Impression & Plan: Assessment: 1.  Myopia ; Both Eyes (H52.13) 2.  CATARACT AGE-RELATED NUCLEAR; Left Eye  (H25.12) 3.  DRY EYE SYNDROME/TEAR FILM INSUFFICIENCY; Both Eyes (H04.123) 4.  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  hold on glasses for now  2.  Cataract is visually significant and account for the patient's complaints. Discussed all risks, benefits, procedures and recovery, including infection, loss of vision and eye, need for glasses after surgery or additional procedures. Patient understands changing glasses will not improve vision.  Patient indicated understanding of procedure. All questions answered. Patient desires to have surgery, recommend phacoemulsification with intraocular lens. Patient to have preliminary testing necessary (Argos/IOL Master, Mac OCT, TOPO) Educational materials provided.  Plan: - proceed with cataract surgery OS - target best distance - RayOne lens - ok with lying flat - no prior eye surgeries  3.  Findings, prognosis and treatment options reviewed. Begin basic treatment regimen; warm compresses, lid scrubs, 1000-2000mg  omega 3 fatty acids, and preserved artificial tears QID.  4.  small pigmented nevus on caruncle OD, 4 mm in size with normal architecture and hair growth from it. Monitor.

## 2022-07-25 ENCOUNTER — Encounter (HOSPITAL_COMMUNITY)
Admission: RE | Admit: 2022-07-25 | Discharge: 2022-07-25 | Disposition: A | Discharge status: Home / Self Care | Payer: 59 | Source: Ambulatory Visit | Attending: Optometry | Admitting: Optometry

## 2022-07-27 ENCOUNTER — Ambulatory Visit (HOSPITAL_BASED_OUTPATIENT_CLINIC_OR_DEPARTMENT_OTHER): Payer: 59 | Admitting: Certified Registered"

## 2022-07-27 ENCOUNTER — Ambulatory Visit (HOSPITAL_COMMUNITY)
Admission: RE | Admit: 2022-07-27 | Discharge: 2022-07-27 | Disposition: A | Discharge status: Home / Self Care | Payer: 59 | Attending: Optometry | Admitting: Optometry

## 2022-07-27 ENCOUNTER — Ambulatory Visit (HOSPITAL_COMMUNITY): Payer: 59 | Admitting: Certified Registered"

## 2022-07-27 ENCOUNTER — Encounter (HOSPITAL_COMMUNITY)
Admission: RE | Disposition: A | Discharge status: Home / Self Care | Payer: Self-pay | Source: Home / Self Care | Attending: Optometry

## 2022-07-27 ENCOUNTER — Encounter (HOSPITAL_COMMUNITY): Payer: Self-pay | Admitting: Optometry

## 2022-07-27 DIAGNOSIS — I252 Old myocardial infarction: Secondary | ICD-10-CM | POA: Insufficient documentation

## 2022-07-27 DIAGNOSIS — I509 Heart failure, unspecified: Secondary | ICD-10-CM | POA: Diagnosis not present

## 2022-07-27 DIAGNOSIS — Z955 Presence of coronary angioplasty implant and graft: Secondary | ICD-10-CM | POA: Insufficient documentation

## 2022-07-27 DIAGNOSIS — F17210 Nicotine dependence, cigarettes, uncomplicated: Secondary | ICD-10-CM | POA: Diagnosis not present

## 2022-07-27 DIAGNOSIS — I110 Hypertensive heart disease with heart failure: Secondary | ICD-10-CM | POA: Diagnosis not present

## 2022-07-27 DIAGNOSIS — I739 Peripheral vascular disease, unspecified: Secondary | ICD-10-CM | POA: Diagnosis not present

## 2022-07-27 DIAGNOSIS — H2512 Age-related nuclear cataract, left eye: Secondary | ICD-10-CM | POA: Diagnosis present

## 2022-07-27 DIAGNOSIS — Z8673 Personal history of transient ischemic attack (TIA), and cerebral infarction without residual deficits: Secondary | ICD-10-CM | POA: Insufficient documentation

## 2022-07-27 HISTORY — PX: CATARACT EXTRACTION W/PHACO: SHX586

## 2022-07-27 SURGERY — PHACOEMULSIFICATION, CATARACT, WITH IOL INSERTION
Anesthesia: Monitor Anesthesia Care | Site: Eye | Laterality: Left

## 2022-07-27 MED ORDER — MIDAZOLAM HCL 2 MG/2ML IJ SOLN
INTRAMUSCULAR | Status: DC | PRN
  Administered 2022-07-27: 2 mg via INTRAVENOUS

## 2022-07-27 MED ORDER — STERILE WATER FOR IRRIGATION IR SOLN
Status: DC | PRN
  Administered 2022-07-27: 25 mL

## 2022-07-27 MED ORDER — POVIDONE-IODINE 5 % OP SOLN
OPHTHALMIC | Status: DC | PRN
  Administered 2022-07-27: 1 via OPHTHALMIC

## 2022-07-27 MED ORDER — SIGHTPATH DOSE#1 NA HYALUR & NA CHOND-NA HYALUR IO KIT
PACK | INTRAOCULAR | Status: DC | PRN
  Administered 2022-07-27: 1 via OPHTHALMIC

## 2022-07-27 MED ORDER — LIDOCAINE HCL (PF) 1 % IJ SOLN
INTRAMUSCULAR | Status: DC | PRN
  Administered 2022-07-27: 1 mL

## 2022-07-27 MED ORDER — BSS IO SOLN
INTRAOCULAR | Status: DC | PRN
  Administered 2022-07-27: 15 mL via INTRAOCULAR

## 2022-07-27 MED ORDER — PHENYLEPHRINE-KETOROLAC 1-0.3 % IO SOLN
INTRAOCULAR | Status: DC | PRN
  Administered 2022-07-27: 500 mL via OPHTHALMIC

## 2022-07-27 MED ORDER — LIDOCAINE HCL 3.5 % OP GEL
1.0000 | Freq: Once | OPHTHALMIC | Status: AC
  Administered 2022-07-27: 1 via OPHTHALMIC

## 2022-07-27 MED ORDER — PHENYLEPHRINE HCL 2.5 % OP SOLN
1.0000 [drp] | OPHTHALMIC | Status: AC | PRN
  Administered 2022-07-27 (×3): 1 [drp] via OPHTHALMIC

## 2022-07-27 MED ORDER — TROPICAMIDE 1 % OP SOLN
1.0000 [drp] | OPHTHALMIC | Status: AC | PRN
  Administered 2022-07-27 (×3): 1 [drp] via OPHTHALMIC

## 2022-07-27 MED ORDER — MIDAZOLAM HCL 2 MG/2ML IJ SOLN
INTRAMUSCULAR | Status: AC
  Filled 2022-07-27: qty 2

## 2022-07-27 MED ORDER — TETRACAINE HCL 0.5 % OP SOLN
1.0000 [drp] | OPHTHALMIC | Status: AC | PRN
  Administered 2022-07-27 (×3): 1 [drp] via OPHTHALMIC

## 2022-07-27 MED ORDER — NEOMYCIN-POLYMYXIN-DEXAMETH 3.5-10000-0.1 OP SUSP
OPHTHALMIC | Status: DC | PRN
  Administered 2022-07-27: 2 [drp] via OPHTHALMIC

## 2022-07-27 MED ORDER — FENTANYL CITRATE (PF) 100 MCG/2ML IJ SOLN
INTRAMUSCULAR | Status: AC
  Filled 2022-07-27: qty 2

## 2022-07-27 SURGICAL SUPPLY — 14 items
CATARACT SUITE SIGHTPATH (MISCELLANEOUS) ×1 IMPLANT
CLOTH BEACON ORANGE TIMEOUT ST (SAFETY) ×1 IMPLANT
DRSG TEGADERM 4X4.75 (GAUZE/BANDAGES/DRESSINGS) ×1 IMPLANT
EYE SHIELD UNIVERSAL CLEAR (GAUZE/BANDAGES/DRESSINGS) IMPLANT
FEE CATARACT SUITE SIGHTPATH (MISCELLANEOUS) ×1 IMPLANT
GLOVE BIOGEL PI IND STRL 7.0 (GLOVE) ×2 IMPLANT
LENS IOL RAYNER 21.5 (Intraocular Lens) ×1 IMPLANT
LENS IOL RAYONE EMV 21.5 (Intraocular Lens) IMPLANT
NDL HYPO 18GX1.5 BLUNT FILL (NEEDLE) ×1 IMPLANT
NEEDLE HYPO 18GX1.5 BLUNT FILL (NEEDLE) ×1 IMPLANT
PAD ARMBOARD 7.5X6 YLW CONV (MISCELLANEOUS) ×1 IMPLANT
SYR TB 1ML LL NO SAFETY (SYRINGE) ×1 IMPLANT
TAPE SURG TRANSPORE 1 IN (GAUZE/BANDAGES/DRESSINGS) IMPLANT
WATER STERILE IRR 250ML POUR (IV SOLUTION) ×1 IMPLANT

## 2022-07-27 NOTE — Discharge Instructions (Signed)
Please discharge patient when stable, will follow up today with Dr. Donata Reddick at the [facility] [place] office immediately following discharge.  Leave shield in place until visit.  All paperwork with discharge instructions will be given at the office.   [facility] Address:  [address]  [place], [place] [address]  Dr. Foy Mungia's Phone: [phone]  

## 2022-07-27 NOTE — Op Note (Signed)
Date of procedure: 07/27/22  Pre-operative diagnosis: Visually significant age-related nuclear cataract, Left Eye (H25.12)  Post-operative diagnosis: Visually significant age-related nuclear cataract, Left Eye  Procedure: Removal of cataract via phacoemulsification and insertion of intra-ocular lens Rayner [id number] +21.5D into the capsular bag of the Left Eye  Attending surgeon: Ronal Fear, MD  Anesthesia: MAC, Topical Akten  Complications: None  Estimated Blood Loss: <53mL (minimal)  Specimens: None  Implants:  Implant Name Type Inv. Item Serial No. Manufacturer Lot No. LRB No. Used Action  LENS IOL RAYNER 21.5 - [id number] Intraocular Lens LENS IOL RAYNER 21.5  S[employer] [id number] Left 1 Implanted    Indications:  Visually significant age-related cataract, Left Eye  Procedure:  The patient was seen and identified in the pre-operative area. The operative eye was identified and dilated.  The operative eye was marked.  Topical anesthesia was administered to the operative eye.     The patient was then to the operative suite and placed in the supine position.  A timeout was performed confirming the patient, procedure to be performed, and all other relevant information.   The patient's face was prepped and draped in the usual fashion for intra-ocular surgery.  A lid speculum was placed into the operative eye and the surgical microscope moved into place and focused.  An inferotemporal paracentesis was created using a 20 gauge paracentesis blade.  BSS mixed with Omidria, followed by 1% lidocaine was injected into the anterior chamber.  Viscoelastic was injected into the anterior chamber.  A temporal clear-corneal main wound incision was created using a 2.72mm microkeratome.  A continuous curvilinear capsulorrhexis was initiated using an irrigating cystitome and completed using capsulorrhexis forceps.  Hydrodissection and hydrodeliniation were performed.  Viscoelastic was injected into the  anterior chamber.  A phacoemulsification handpiece and a chopper as a second instrument were used to remove the nucleus and epinucleus. The irrigation/aspiration handpiece was used to remove any remaining cortical material.   The capsular bag was reinflated with viscoelastic, checked, and found to be intact.  The intraocular lens was inserted into the capsular bag.  The irrigation/aspiration handpiece was used to remove any remaining viscoelastic.  The clear corneal wound and paracentesis wounds were then hydrated and checked with Weck-Cels to be watertight.  The lid-speculum and drape was removed, and the patient's face was cleaned with a wet and dry 4x4.  Maxitrol drops were instilled onto the eye. A clear shield was taped over the eye. The patient was taken to the post-operative care unit in good condition, having tolerated the procedure well.  Post-Op Instructions: The patient will follow up at [facility] for a same day post-operative evaluation and will receive all other orders and instructions.

## 2022-07-27 NOTE — Interval H&P Note (Signed)
History and Physical Interval Note:  07/27/2022 11:13 AM  The H and P was reviewed and updated. The patient was examined.  No changes were found after exam.  The surgical eye was marked.  Emily Patterson

## 2022-07-27 NOTE — Transfer of Care (Signed)
Immediate Anesthesia Transfer of Care Note  Patient: Emily Patterson  Procedure(s) Performed: CATARACT EXTRACTION PHACO AND INTRAOCULAR LENS PLACEMENT (IOC) (Left: Eye)  Patient Location: PACU and Short Stay  Anesthesia Type:MAC  Level of Consciousness: awake and patient cooperative  Airway & Oxygen Therapy: Patient Spontanous Breathing  Post-op Assessment: Report given to RN and Post -op Vital signs reviewed and stable  Post vital signs: Reviewed and stable  Last Vitals:  Vitals Value Taken Time  BP 108/67 07/27/22 1211  Temp 36.4 C 07/27/22 1211  Pulse 51 07/27/22 1211  Resp 13 07/27/22 1211  SpO2 100 % 07/27/22 1211    Last Pain:  Vitals:   07/27/22 1211  TempSrc: Oral  PainSc: 0-No pain      Patients Stated Pain Goal: 9 (07/27/22 1005)  Complications: No notable events documented.

## 2022-07-27 NOTE — Anesthesia Preprocedure Evaluation (Signed)
Anesthesia Evaluation  Patient identified by MRN, date of birth, ID band Patient awake    Reviewed: Allergy & Precautions, H&P , NPO status , Patient's Chart, lab work & pertinent test results, reviewed documented beta blocker date and time   Airway Mallampati: II  TM Distance: >3 FB Neck ROM: Full    Dental  (+) Dental Advisory Given, Upper Dentures, Edentulous Lower   Pulmonary Current Smoker and Patient abstained from smoking.   Pulmonary exam normal breath sounds clear to auscultation       Cardiovascular hypertension, Pt. on medications and Pt. on home beta blockers + Past MI, + Cardiac Stents, + Peripheral Vascular Disease (carotid sx) and +CHF  Normal cardiovascular exam Rhythm:Regular Rate:Normal  13-Jun-2021 15:49:34 [facility]-[id number] ROUTINE RECORD 06-May-1955 (66 yr) Female Caucasian Vent. rate 54 BPM PR interval 172 ms QRS duration 102 ms QT/QTcB 468/443 ms P-R-T axes 53 66 98 Sinus bradycardia Nonspecific T wave abnormality Abnormal ECG When compared with ECG of 04-May-2012 11:36, Vent. rate has decreased BY 36 BPM Nonspecific T wave abnormality now evident in Lateral leads Otherwise no significant change Confirmed by Harding, David ([id number]) on 06/14/2021 12:03:21 AM   Neuro/Psych  Headaches CVA  negative psych ROS   GI/Hepatic negative GI ROS, Neg liver ROS,,,  Endo/Other  negative endocrine ROS    Renal/GU negative Renal ROS  negative genitourinary   Musculoskeletal negative musculoskeletal ROS (+)    Abdominal   Peds negative pediatric ROS (+)  Hematology negative hematology ROS (+)   Anesthesia Other Findings   Reproductive/Obstetrics negative OB ROS                             Anesthesia Physical Anesthesia Plan  ASA: 3  Anesthesia Plan: MAC   Post-op Pain Management: Minimal or no pain anticipated   Induction: Intravenous  PONV Risk Score  and Plan: Treatment may vary due to age or medical condition  Airway Management Planned: Nasal Cannula and Natural Airway  Additional Equipment:   Intra-op Plan:   Post-operative Plan:   Informed Consent: I have reviewed the patients History and Physical, chart, labs and discussed the procedure including the risks, benefits and alternatives for the proposed anesthesia with the patient or authorized representative who has indicated his/her understanding and acceptance.     Dental advisory given  Plan Discussed with: CRNA and Surgeon  Anesthesia Plan Comments:        Anesthesia Quick Evaluation  

## 2022-07-28 NOTE — Anesthesia Postprocedure Evaluation (Signed)
Anesthesia Post Note  Patient: LORREN LOGSTON  Procedure(s) Performed: CATARACT EXTRACTION PHACO AND INTRAOCULAR LENS PLACEMENT (IOC) (Left: Eye)  Patient location during evaluation: Phase II Anesthesia Type: MAC Level of consciousness: awake Pain management: pain level controlled Vital Signs Assessment: post-procedure vital signs reviewed and stable Respiratory status: spontaneous breathing and respiratory function stable Cardiovascular status: blood pressure returned to baseline and stable Postop Assessment: no headache and no apparent nausea or vomiting Anesthetic complications: no Comments: Late entry   No notable events documented.   Last Vitals:  Vitals:   07/27/22 1005 07/27/22 1211  BP: 124/80 108/67  Pulse:  (!) 51  Resp: 15 13  Temp: (!) 36.4 C (!) 36.4 C  SpO2: 100% 100%    Last Pain:  Vitals:   07/27/22 1211  TempSrc: Oral  PainSc: 0-No pain                 Windell Norfolk

## 2022-08-02 ENCOUNTER — Encounter (HOSPITAL_COMMUNITY): Payer: Self-pay | Admitting: Optometry

## 2022-08-31 ENCOUNTER — Encounter (HOSPITAL_COMMUNITY): Payer: Self-pay | Admitting: Optometry

## 2022-09-07 ENCOUNTER — Other Ambulatory Visit (HOSPITAL_COMMUNITY): Payer: Self-pay | Admitting: Cardiology

## 2022-09-25 DIAGNOSIS — J209 Acute bronchitis, unspecified: Secondary | ICD-10-CM | POA: Insufficient documentation

## 2022-09-25 DIAGNOSIS — R053 Chronic cough: Secondary | ICD-10-CM | POA: Insufficient documentation

## 2022-10-03 ENCOUNTER — Ambulatory Visit (INDEPENDENT_AMBULATORY_CARE_PROVIDER_SITE_OTHER): Payer: 59 | Admitting: Orthopaedic Surgery

## 2022-10-03 ENCOUNTER — Encounter: Payer: Self-pay | Admitting: Orthopaedic Surgery

## 2022-10-03 VITALS — BP 105/64 | HR 60 | Ht 61.0 in | Wt 120.0 lb

## 2022-10-03 DIAGNOSIS — M79671 Pain in right foot: Secondary | ICD-10-CM

## 2022-10-03 NOTE — Progress Notes (Signed)
Subjective:    Patient ID: Emily Patterson, female    DOB: [birth year], 67 y.o.   MRN: [id number]  HPI She hurt her right little toe on 10-01-22 at home.  She was seen at [facility] for this.  I have reviewed the notes and xray report.  No fracture was present.  She was given a post op shoe to wear but she says it makes it worse.  She was given two pills for pain and told to come here for further treatment.  She has no other injury.  She did lose her little toe nail.  Patient is on buprenorphine-nalox 8-2 film, just having it refilled 10-01-22.   Review of Systems  Constitutional:  Positive for activity change.  Musculoskeletal:  Positive for arthralgias and gait problem.  Psychiatric/Behavioral:         On buprenorphine   For Review of Systems, all other systems reviewed and are negative.  The following is a summary of the past history medically, past history surgically, known current medicines, social history and family history.  This information is gathered electronically by the computer from prior information and documentation.  I review this each visit and have found including this information at this point in the chart is beneficial and informative.   Past Medical History:  Diagnosis Date   Cervicogenic headache 10/08/2013   Hyperlipidemia    Hypertension    Leaky heart valve    [facility] Earna Coder [facility]   Myocardial infarction [facility]) 2010   follow[facility] Dr[facility] 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    Past Surgical History:  Procedure Laterality Date   CAROTID ENDARTERECTOMY Left    CATARACT EXTRACTION W/PHACO Right 07/13/2022   Procedure: CATARACT EXTRACTION PHACO AND INTRAOCULAR LENS PLACEMENT (IOC);  Surgeon: Pecolia Ades, MD;  Location: [facility] ORS;  Service: Ophthalmology;  Laterality: Right;  CDE: 6.96   CATARACT EXTRACTION W/PHACO Left 07/27/2022   Procedure: CATARACT EXTRACTION PHACO AND INTRAOCULAR  LENS PLACEMENT (IOC);  Surgeon: Pecolia Ades, MD;  Location: [facility];  Service: Ophthalmology;  Laterality: Left;  CDE: 5.87   CORONARY ANGIOPLASTY WITH STENT PLACEMENT  2010   OPEN REDUCTION INTERNAL FIXATION (ORIF) DISTAL RADIAL FRACTURE Right 08/15/2012   Procedure: OPEN REDUCTION INTERNAL FIXATION (ORIF) RIGHT DISTAL RADIAL FRACTURE;  Surgeon: Kathryne Hitch, MD;  Location: [facility];  Service: Orthopedics;  Laterality: Right;   TUBAL LIGATION      Current Outpatient Medications on File Prior to Visit  Medication Sig Dispense Refill   albuterol (VENTOLIN HFA) 108 (90 Base) MCG/ACT inhaler Inhale 2 puffs into the lungs every 6 (six) hours as needed for wheezing or shortness of breath.      aspirin EC 81 MG tablet Take 81 mg by mouth every morning.      budesonide-formoterol (SYMBICORT) 80-4.5 MCG/ACT inhaler Inhale 2 puffs into the lungs 2 (two) times daily. 1 each 0   carvedilol (COREG) 6.25 MG tablet Take 6.25 mg by mouth 2 (two) times daily with a meal.     dapagliflozin propanediol (FARXIGA) 10 MG TABS tablet Take 1 tablet (10 mg total) by mouth daily before breakfast. 30 tablet 11   rosuvastatin (CRESTOR) 20 MG tablet Take 20 mg by mouth daily.     nitroGLYCERIN (NITROSTAT) 0.4 MG SL tablet Place 1 tablet (0.4 mg total) under the tongue every 5 (five) minutes  as needed for chest pain. 90 tablet 3   No current facility-administered medications on file prior to visit.    Social History   Socioeconomic History   Marital status: Legally Separated    Spouse name: Not on file   Number of children: 2   Years of education: 9th   Highest education level: Not on file  Occupational History   Occupation: disability  Tobacco Use   Smoking status: Some Days    Current packs/day: 1.00    Average packs/day: 1 pack/day for 20.0 years (20.0 ttl pk-yrs)    Types: Cigarettes   Smokeless tobacco: Never  Vaping Use   Vaping status: Never Used  Substance and Sexual Activity   Alcohol  use: No   Drug use: No   Sexual activity: Yes    Birth control/protection: Surgical  Other Topics Concern   Not on file  Social History Narrative   Not on file   Social Determinants of Health   Financial Resource Strain: Not on file  Food Insecurity: Not on file  Transportation Needs: Not on file  Physical Activity: Not on file  Stress: Not on file  Social Connections: Not on file  Intimate Partner Violence: Not on file    Family History  Problem Relation Age of Onset   Diabetes Mother    Dementia Mother    Stroke Mother    Cirrhosis Father    Diabetes Sister     BP 105/64   Pulse 60   Ht 5\' 1"  (1.549 m)   Wt 120 lb (54.4 kg) Comment: per patient wasn't able to get on scale  BMI 22.67 kg/m   Body mass index is 22.67 kg/m.       Objective:   Physical Exam Vitals and nursing note reviewed. Exam conducted with a chaperone present.  Constitutional:      Appearance: She is well-developed.  HENT:     Head: Normocephalic and atraumatic.  Eyes:     Conjunctiva/sclera: Conjunctivae normal.     Pupils: Pupils are equal, round, and reactive to light.  Cardiovascular:     Rate and Rhythm: Normal rate and regular rhythm.  Pulmonary:     Effort: Pulmonary effort is normal.  Abdominal:     Palpations: Abdomen is soft.  Musculoskeletal:     Cervical back: Normal range of motion and neck supple.       Feet:  Skin:    General: Skin is warm and dry.  Neurological:     Mental Status: She is alert and oriented to person, place, and time.     Cranial Nerves: No cranial nerve deficit.     Motor: No abnormal muscle tone.     Coordination: Coordination normal.     Deep Tendon Reflexes: Reflexes are normal and symmetric. Reflexes normal.  Psychiatric:        Behavior: Behavior normal.        Thought Content: Thought content normal.        Judgment: Judgment normal.           Assessment & Plan:   Encounter Diagnosis  Name Primary?   Acute pain of right foot  Yes   I have told her to buddy tape the little toe to the fourth toe with gauze between them.  Instructions for contrast baths given. Go to post op shoe when able.  She wants pain medicine.  I told her I cannot do this as she is on the buprenorphine.  She says  she will get medicine "on the street".  I will see her back in one week.  Call if any problem.  Precautions discussed.  Electronically Signed Darreld Mclean, MD 7/17/202410:24 AM

## 2022-10-03 NOTE — Patient Instructions (Signed)
Contrast baths are excellent for joints of the body which have been sprained or strained and have swelling. Professional athletes use this to reduce swelling and this helps them to resume activity as soon as possible.  Contrast baths consist of placing a foot or hand into a basin of warm (not hot) water for a short period of time and then taking the extremity and moving it immediately into a solution of ice water. It is kept in the ice cold water for a short period of time, as long as the patient can tolerate, then moved back into the warm solution.  The hand or foot is then removed and put back in the cold solution and kept there as long as possible and then removed and no further soaks are done.   When you first start doing this you will experience the "pins and needle" effect putting your hand in the cold solution. You may only tolerate the ice cold solution for a period of 5 to 30 seconds. You will not like this at first. It will hurt. Please be patient and attempt it as best as you can.   The goal is to place the extremity in the cold water for 5 minutes then place in warm water for 5 min. Repeating until 30 minutes have passed. (that's 3 times in each)  In a few days you will enjoy having your foot or hand in the cold water. As the treatment progresses, you will then find the pins and needle effect taking place after you have had your extremity in the cold solution and then placing it in the warm. It will then be the warm solution that you will dread.  You should notice decreased swelling in your extremity.   After finishing the contrast baths, you need to elevate your extremity for 10 to 15 minutes. For the ankle you will have your toes higher than your nose, and for the hand you will need your hand above the nose as well.   You will need to continue this for 7-10 days unless otherwise directed.    If you have any questions, please contact our office during office hours.   

## 2022-10-10 ENCOUNTER — Ambulatory Visit: Payer: 59 | Admitting: Orthopaedic Surgery

## 2022-10-16 ENCOUNTER — Other Ambulatory Visit (HOSPITAL_COMMUNITY): Payer: Self-pay | Admitting: Cardiology

## 2023-04-05 ENCOUNTER — Telehealth: Payer: Self-pay | Admitting: Orthopaedic Surgery

## 2023-04-05 NOTE — Telephone Encounter (Signed)
Tried to return the pt's call, mailbox is not setup.

## 2023-05-13 DIAGNOSIS — Z122 Encounter for screening for malignant neoplasm of respiratory organs: Secondary | ICD-10-CM | POA: Diagnosis not present

## 2023-05-13 DIAGNOSIS — R053 Chronic cough: Secondary | ICD-10-CM | POA: Diagnosis not present

## 2023-05-13 DIAGNOSIS — R918 Other nonspecific abnormal finding of lung field: Secondary | ICD-10-CM | POA: Diagnosis not present

## 2023-05-13 DIAGNOSIS — F17200 Nicotine dependence, unspecified, uncomplicated: Secondary | ICD-10-CM | POA: Diagnosis not present

## 2023-05-13 DIAGNOSIS — J449 Chronic obstructive pulmonary disease, unspecified: Secondary | ICD-10-CM | POA: Diagnosis not present

## 2023-05-20 DIAGNOSIS — S6990XA Unspecified injury of unspecified wrist, hand and finger(s), initial encounter: Secondary | ICD-10-CM | POA: Insufficient documentation

## 2023-05-20 DIAGNOSIS — F1910 Other psychoactive substance abuse, uncomplicated: Secondary | ICD-10-CM | POA: Insufficient documentation

## 2023-05-20 DIAGNOSIS — F419 Anxiety disorder, unspecified: Secondary | ICD-10-CM | POA: Insufficient documentation

## 2023-05-20 DIAGNOSIS — F17200 Nicotine dependence, unspecified, uncomplicated: Secondary | ICD-10-CM | POA: Insufficient documentation

## 2023-05-20 DIAGNOSIS — F29 Unspecified psychosis not due to a substance or known physiological condition: Secondary | ICD-10-CM | POA: Insufficient documentation

## 2023-05-20 DIAGNOSIS — I779 Disorder of arteries and arterioles, unspecified: Secondary | ICD-10-CM | POA: Insufficient documentation

## 2023-05-20 DIAGNOSIS — R55 Syncope and collapse: Secondary | ICD-10-CM | POA: Insufficient documentation

## 2023-05-20 DIAGNOSIS — N390 Urinary tract infection, site not specified: Secondary | ICD-10-CM | POA: Insufficient documentation

## 2023-05-23 ENCOUNTER — Encounter: Payer: Self-pay | Admitting: Orthopedic Surgery

## 2023-05-23 ENCOUNTER — Ambulatory Visit: Payer: 59 | Admitting: Orthopedic Surgery

## 2023-05-23 VITALS — BP 135/87 | HR 78 | Ht 61.0 in | Wt 124.0 lb

## 2023-05-23 DIAGNOSIS — M65312 Trigger thumb, left thumb: Secondary | ICD-10-CM

## 2023-05-23 DIAGNOSIS — M65311 Trigger thumb, right thumb: Secondary | ICD-10-CM

## 2023-05-23 MED ORDER — METHYLPREDNISOLONE ACETATE 40 MG/ML IJ SUSP
40.0000 mg | Freq: Once | INTRAMUSCULAR | Status: AC
  Administered 2023-05-23: 40 mg via INTRA_ARTICULAR

## 2023-05-23 NOTE — Progress Notes (Signed)
  Subjective:     Patient ID: Emily Patterson, female   DOB: 06-Dec-1955, 68 y.o.   MRN: [id number]  We have a 68 year old female presents with history of locking stiffness and pain in the right and left thumb.  She complains of inability to open tops and clicking and popping of the right thumb greater than the left.  No prior treatment  Hand Pain  Pertinent negatives include no numbness.     Review of Systems  Neurological:  Negative for weakness and numbness.       Objective:   Physical Exam Vitals and nursing note reviewed.  Constitutional:      Appearance: Normal appearance.  HENT:     Head: Normocephalic and atraumatic.  Eyes:     General: No scleral icterus.       Right eye: No discharge.        Left eye: No discharge.     Extraocular Movements: Extraocular movements intact.     Conjunctiva/sclera: Conjunctivae normal.     Pupils: Pupils are equal, round, and reactive to light.  Cardiovascular:     Rate and Rhythm: Normal rate.     Pulses: Normal pulses.  Skin:    General: Skin is warm and dry.     Capillary Refill: Capillary refill takes less than 2 seconds.  Neurological:     General: No focal deficit present.     Mental Status: She is alert and oriented to person, place, and time.  Psychiatric:        Mood and Affect: Mood normal.        Behavior: Behavior normal.        Thought Content: Thought content normal.        Judgment: Judgment normal.        Assessment:     Encounter Diagnoses  Name Primary?   Trigger thumb of right hand Yes   Trigger thumb, left thumb         Plan:     Recommend injection both thumbs  Patient would like to schedule an appointment for ankle evaluation  Left Trigger thumb injection Medication  1 mL of 40 mg Depo-Medrol  2 mL of 1% lidocaine plain  Ethyl chloride for anesthesia  Verbal consent was obtained timeout was taken to confirm the injection site as left thumb  Alcohol was used to prepare the skin along  with ethyl chloride and then the injection was made at the A1 pulley there were no complications    Right Trigger thumb injection Medication  1 mL of 40 mg Depo-Medrol  2 mL of 1% lidocaine plain  Ethyl chloride for anesthesia  Verbal consent was obtained timeout was taken to confirm the injection site as right thumb  Alcohol was used to prepare the skin along with ethyl chloride and then the injection was made at the A1 pulley there were no complications

## 2023-05-23 NOTE — Progress Notes (Signed)
 dep Intake history:  BP 135/87   Pulse 78   Ht 5\' 1"  (1.549 m)   Wt 124 lb (56.2 kg)   BMI 23.43 kg/m  Body mass index is 23.43 kg/m.    WHAT ARE WE SEEING YOU FOR TODAY?   bilateral hand(s) thumbs painful stiff triggering   How long has this bothered you? (DOI?DOS?WS?)  1 month(s) ago  Anticoag.  No  Diabetes No  Heart disease Yes  Hypertension Yes  SMOKING HX Yes  Kidney disease No  Any ALLERGIES ___________________ Allergies  Allergen Reactions   Lyrica [Pregabalin]     Facial swelling   Other Nausea And Vomiting    BLOOD PRESSURE CUFF: Causes nausea and vomiting, upset stomach   Ultram [Tramadol]    Latex Rash   ___________________________   Treatment:  Have you taken:  Tylenol Yes  Advil Yes  Had PT No  Had injection No  Other  ________________Percocet _________

## 2023-05-23 NOTE — Progress Notes (Signed)
  Intake history:  BP 135/87   Pulse 78   Ht 5\' 1"  (1.549 m)   Wt 124 lb (56.2 kg)   BMI 23.43 kg/m  Body mass index is 23.43 kg/m.    WHAT ARE WE SEEING YOU FOR TODAY?   bilateral hand(s) thumbs painful stiff triggering   How long has this bothered you? (DOI?DOS?WS?)  1 month(s) ago  Anticoag.  No  Diabetes No  Heart disease Yes  Hypertension Yes  SMOKING HX Yes  Kidney disease No  Any ALLERGIES ___________________ Allergies  Allergen Reactions   Lyrica [Pregabalin]     Facial swelling   Other Nausea And Vomiting    BLOOD PRESSURE CUFF: Causes nausea and vomiting, upset stomach   Ultram [Tramadol]    Latex Rash   ___________________________   Treatment:  Have you taken:  Tylenol Yes  Advil Yes  Had PT No  Had injection No  Other  ________________Percocet _________

## 2023-06-07 ENCOUNTER — Encounter: Admitting: Orthopedic Surgery

## 2023-08-19 DIAGNOSIS — F17290 Nicotine dependence, other tobacco product, uncomplicated: Secondary | ICD-10-CM | POA: Diagnosis not present

## 2023-09-11 DIAGNOSIS — I255 Ischemic cardiomyopathy: Secondary | ICD-10-CM | POA: Diagnosis not present

## 2023-09-11 DIAGNOSIS — I6529 Occlusion and stenosis of unspecified carotid artery: Secondary | ICD-10-CM | POA: Diagnosis not present

## 2023-09-11 DIAGNOSIS — J449 Chronic obstructive pulmonary disease, unspecified: Secondary | ICD-10-CM | POA: Diagnosis not present

## 2023-09-11 DIAGNOSIS — E785 Hyperlipidemia, unspecified: Secondary | ICD-10-CM | POA: Diagnosis not present

## 2023-09-11 DIAGNOSIS — I2510 Atherosclerotic heart disease of native coronary artery without angina pectoris: Secondary | ICD-10-CM | POA: Diagnosis not present

## 2023-09-11 DIAGNOSIS — I10 Essential (primary) hypertension: Secondary | ICD-10-CM | POA: Diagnosis not present

## 2023-09-23 DIAGNOSIS — F17290 Nicotine dependence, other tobacco product, uncomplicated: Secondary | ICD-10-CM | POA: Diagnosis not present

## 2023-09-24 DIAGNOSIS — I255 Ischemic cardiomyopathy: Secondary | ICD-10-CM | POA: Diagnosis not present

## 2023-09-24 DIAGNOSIS — J449 Chronic obstructive pulmonary disease, unspecified: Secondary | ICD-10-CM | POA: Diagnosis not present

## 2023-09-24 DIAGNOSIS — I2510 Atherosclerotic heart disease of native coronary artery without angina pectoris: Secondary | ICD-10-CM | POA: Diagnosis not present

## 2023-09-24 DIAGNOSIS — E785 Hyperlipidemia, unspecified: Secondary | ICD-10-CM | POA: Diagnosis not present

## 2023-09-24 DIAGNOSIS — I10 Essential (primary) hypertension: Secondary | ICD-10-CM | POA: Diagnosis not present

## 2023-09-24 DIAGNOSIS — I6529 Occlusion and stenosis of unspecified carotid artery: Secondary | ICD-10-CM | POA: Diagnosis not present

## 2023-09-30 DIAGNOSIS — F17200 Nicotine dependence, unspecified, uncomplicated: Secondary | ICD-10-CM | POA: Diagnosis not present

## 2023-10-10 DIAGNOSIS — I255 Ischemic cardiomyopathy: Secondary | ICD-10-CM | POA: Diagnosis not present

## 2023-10-10 DIAGNOSIS — E785 Hyperlipidemia, unspecified: Secondary | ICD-10-CM | POA: Diagnosis not present

## 2023-10-10 DIAGNOSIS — I6529 Occlusion and stenosis of unspecified carotid artery: Secondary | ICD-10-CM | POA: Diagnosis not present

## 2023-10-10 DIAGNOSIS — I2510 Atherosclerotic heart disease of native coronary artery without angina pectoris: Secondary | ICD-10-CM | POA: Diagnosis not present

## 2023-10-10 DIAGNOSIS — J449 Chronic obstructive pulmonary disease, unspecified: Secondary | ICD-10-CM | POA: Diagnosis not present

## 2023-10-10 DIAGNOSIS — I10 Essential (primary) hypertension: Secondary | ICD-10-CM | POA: Diagnosis not present

## 2023-10-15 DIAGNOSIS — J449 Chronic obstructive pulmonary disease, unspecified: Secondary | ICD-10-CM | POA: Diagnosis not present

## 2023-10-15 DIAGNOSIS — I255 Ischemic cardiomyopathy: Secondary | ICD-10-CM | POA: Diagnosis not present

## 2023-10-15 DIAGNOSIS — J9809 Other diseases of bronchus, not elsewhere classified: Secondary | ICD-10-CM | POA: Diagnosis not present

## 2023-10-15 DIAGNOSIS — Z885 Allergy status to narcotic agent status: Secondary | ICD-10-CM | POA: Diagnosis not present

## 2023-10-15 DIAGNOSIS — Z87891 Personal history of nicotine dependence: Secondary | ICD-10-CM | POA: Diagnosis not present

## 2023-10-15 DIAGNOSIS — Z122 Encounter for screening for malignant neoplasm of respiratory organs: Secondary | ICD-10-CM | POA: Diagnosis not present

## 2023-10-15 DIAGNOSIS — Z91040 Latex allergy status: Secondary | ICD-10-CM | POA: Diagnosis not present

## 2023-10-15 DIAGNOSIS — R053 Chronic cough: Secondary | ICD-10-CM | POA: Diagnosis not present

## 2023-10-15 DIAGNOSIS — E785 Hyperlipidemia, unspecified: Secondary | ICD-10-CM | POA: Diagnosis not present

## 2023-10-15 DIAGNOSIS — J432 Centrilobular emphysema: Secondary | ICD-10-CM | POA: Diagnosis not present

## 2023-10-15 DIAGNOSIS — I2510 Atherosclerotic heart disease of native coronary artery without angina pectoris: Secondary | ICD-10-CM | POA: Diagnosis not present

## 2023-10-15 DIAGNOSIS — R918 Other nonspecific abnormal finding of lung field: Secondary | ICD-10-CM | POA: Diagnosis not present

## 2023-10-15 DIAGNOSIS — I6529 Occlusion and stenosis of unspecified carotid artery: Secondary | ICD-10-CM | POA: Diagnosis not present

## 2023-10-15 DIAGNOSIS — I10 Essential (primary) hypertension: Secondary | ICD-10-CM | POA: Diagnosis not present

## 2023-10-28 DIAGNOSIS — R053 Chronic cough: Secondary | ICD-10-CM | POA: Diagnosis not present

## 2023-10-28 DIAGNOSIS — R918 Other nonspecific abnormal finding of lung field: Secondary | ICD-10-CM | POA: Diagnosis not present

## 2023-10-28 DIAGNOSIS — F17200 Nicotine dependence, unspecified, uncomplicated: Secondary | ICD-10-CM | POA: Diagnosis not present

## 2023-10-28 DIAGNOSIS — J449 Chronic obstructive pulmonary disease, unspecified: Secondary | ICD-10-CM | POA: Diagnosis not present

## 2023-10-28 DIAGNOSIS — Z122 Encounter for screening for malignant neoplasm of respiratory organs: Secondary | ICD-10-CM | POA: Diagnosis not present

## 2024-01-07 DIAGNOSIS — H26491 Other secondary cataract, right eye: Secondary | ICD-10-CM | POA: Diagnosis not present
